# Patient Record
Sex: Female | Born: 1940 | Race: White | Hispanic: No | Smoking: Never smoker
Health system: Southern US, Community
[De-identification: ages and names within clinical notes are randomized; demographics above are authoritative.]

## PROBLEM LIST (undated history)

## (undated) DIAGNOSIS — I8390 Asymptomatic varicose veins of unspecified lower extremity: Secondary | ICD-10-CM

## (undated) DIAGNOSIS — I10 Essential (primary) hypertension: Secondary | ICD-10-CM

## (undated) DIAGNOSIS — S82892A Other fracture of left lower leg, initial encounter for closed fracture: Secondary | ICD-10-CM

## (undated) DIAGNOSIS — M1990 Unspecified osteoarthritis, unspecified site: Secondary | ICD-10-CM

## (undated) DIAGNOSIS — R7301 Impaired fasting glucose: Secondary | ICD-10-CM

## (undated) DIAGNOSIS — G500 Trigeminal neuralgia: Secondary | ICD-10-CM

## (undated) DIAGNOSIS — J45909 Unspecified asthma, uncomplicated: Secondary | ICD-10-CM

## (undated) DIAGNOSIS — F329 Major depressive disorder, single episode, unspecified: Secondary | ICD-10-CM

## (undated) DIAGNOSIS — E785 Hyperlipidemia, unspecified: Secondary | ICD-10-CM

## (undated) DIAGNOSIS — I8000 Phlebitis and thrombophlebitis of superficial vessels of unspecified lower extremity: Secondary | ICD-10-CM

## (undated) DIAGNOSIS — F32A Depression, unspecified: Secondary | ICD-10-CM

## (undated) DIAGNOSIS — S82891A Other fracture of right lower leg, initial encounter for closed fracture: Secondary | ICD-10-CM

## (undated) DIAGNOSIS — L439 Lichen planus, unspecified: Secondary | ICD-10-CM

## (undated) HISTORY — PX: COLONOSCOPY: SHX174

## (undated) HISTORY — DX: Phlebitis and thrombophlebitis of superficial vessels of unspecified lower extremity: I80.00

## (undated) HISTORY — DX: Impaired fasting glucose: R73.01

## (undated) HISTORY — DX: Depression, unspecified: F32.A

## (undated) HISTORY — PX: ABDOMINAL HYSTERECTOMY: SHX81

## (undated) HISTORY — DX: Other fracture of right lower leg, initial encounter for closed fracture: S82.891A

## (undated) HISTORY — DX: Unspecified asthma, uncomplicated: J45.909

## (undated) HISTORY — DX: Trigeminal neuralgia: G50.0

## (undated) HISTORY — DX: Asymptomatic varicose veins of unspecified lower extremity: I83.90

## (undated) HISTORY — DX: Major depressive disorder, single episode, unspecified: F32.9

## (undated) HISTORY — DX: Hyperlipidemia, unspecified: E78.5

## (undated) HISTORY — DX: Unspecified osteoarthritis, unspecified site: M19.90

## (undated) HISTORY — DX: Essential (primary) hypertension: I10

## (undated) HISTORY — DX: Lichen planus, unspecified: L43.9

## (undated) HISTORY — DX: Other fracture of left lower leg, initial encounter for closed fracture: S82.892A

---

## 1999-05-12 ENCOUNTER — Encounter: Payer: Self-pay | Admitting: Family Medicine

## 1999-05-12 ENCOUNTER — Encounter: Admission: RE | Admit: 1999-05-12 | Discharge: 1999-05-12 | Payer: Self-pay | Admitting: Family Medicine

## 1999-07-27 ENCOUNTER — Other Ambulatory Visit: Admission: RE | Admit: 1999-07-27 | Discharge: 1999-07-27 | Payer: Self-pay | Admitting: Family Medicine

## 2000-07-26 ENCOUNTER — Encounter: Payer: Self-pay | Admitting: Family Medicine

## 2000-07-26 ENCOUNTER — Encounter: Admission: RE | Admit: 2000-07-26 | Discharge: 2000-07-26 | Payer: Self-pay | Admitting: Family Medicine

## 2000-11-02 ENCOUNTER — Other Ambulatory Visit: Admission: RE | Admit: 2000-11-02 | Discharge: 2000-11-02 | Payer: Self-pay | Admitting: Family Medicine

## 2001-10-12 ENCOUNTER — Encounter: Admission: RE | Admit: 2001-10-12 | Discharge: 2001-10-12 | Payer: Self-pay | Admitting: Family Medicine

## 2001-10-12 ENCOUNTER — Encounter: Payer: Self-pay | Admitting: Family Medicine

## 2001-10-25 ENCOUNTER — Other Ambulatory Visit: Admission: RE | Admit: 2001-10-25 | Discharge: 2001-10-25 | Payer: Self-pay | Admitting: Family Medicine

## 2002-12-06 ENCOUNTER — Encounter: Payer: Self-pay | Admitting: Family Medicine

## 2002-12-06 ENCOUNTER — Encounter: Admission: RE | Admit: 2002-12-06 | Discharge: 2002-12-06 | Payer: Self-pay | Admitting: Family Medicine

## 2003-07-06 DIAGNOSIS — S82892A Other fracture of left lower leg, initial encounter for closed fracture: Secondary | ICD-10-CM

## 2003-07-06 DIAGNOSIS — S82891A Other fracture of right lower leg, initial encounter for closed fracture: Secondary | ICD-10-CM

## 2003-07-06 HISTORY — DX: Other fracture of right lower leg, initial encounter for closed fracture: S82.891A

## 2003-07-06 HISTORY — DX: Other fracture of right lower leg, initial encounter for closed fracture: S82.892A

## 2004-06-16 ENCOUNTER — Encounter: Admission: RE | Admit: 2004-06-16 | Discharge: 2004-06-16 | Payer: Self-pay | Admitting: Family Medicine

## 2005-09-28 ENCOUNTER — Encounter: Admission: RE | Admit: 2005-09-28 | Discharge: 2005-09-28 | Payer: Self-pay | Admitting: Family Medicine

## 2007-03-21 ENCOUNTER — Encounter: Admission: RE | Admit: 2007-03-21 | Discharge: 2007-03-21 | Payer: Self-pay | Admitting: Family Medicine

## 2007-05-29 ENCOUNTER — Ambulatory Visit (HOSPITAL_COMMUNITY): Admission: RE | Admit: 2007-05-29 | Discharge: 2007-05-29 | Payer: Self-pay | Admitting: Internal Medicine

## 2007-06-20 ENCOUNTER — Encounter: Payer: Self-pay | Admitting: Pulmonary Disease

## 2007-07-03 ENCOUNTER — Ambulatory Visit: Payer: Self-pay | Admitting: Pulmonary Disease

## 2007-07-03 DIAGNOSIS — J479 Bronchiectasis, uncomplicated: Secondary | ICD-10-CM

## 2007-07-03 DIAGNOSIS — R930 Abnormal findings on diagnostic imaging of skull and head, not elsewhere classified: Secondary | ICD-10-CM

## 2007-07-23 DIAGNOSIS — K219 Gastro-esophageal reflux disease without esophagitis: Secondary | ICD-10-CM | POA: Insufficient documentation

## 2007-07-23 DIAGNOSIS — J309 Allergic rhinitis, unspecified: Secondary | ICD-10-CM | POA: Insufficient documentation

## 2007-11-01 ENCOUNTER — Telehealth (INDEPENDENT_AMBULATORY_CARE_PROVIDER_SITE_OTHER): Payer: Self-pay | Admitting: *Deleted

## 2008-03-21 ENCOUNTER — Encounter: Admission: RE | Admit: 2008-03-21 | Discharge: 2008-03-21 | Payer: Self-pay | Admitting: Family Medicine

## 2009-09-09 ENCOUNTER — Encounter: Admission: RE | Admit: 2009-09-09 | Discharge: 2009-09-09 | Payer: Self-pay | Admitting: Internal Medicine

## 2011-05-06 ENCOUNTER — Other Ambulatory Visit: Payer: Self-pay | Admitting: Internal Medicine

## 2011-05-06 DIAGNOSIS — Z1231 Encounter for screening mammogram for malignant neoplasm of breast: Secondary | ICD-10-CM

## 2011-06-02 ENCOUNTER — Ambulatory Visit
Admission: RE | Admit: 2011-06-02 | Discharge: 2011-06-02 | Disposition: A | Discharge status: Home / Self Care | Payer: BC Managed Care – PPO | Source: Ambulatory Visit | Attending: Internal Medicine | Admitting: Internal Medicine

## 2011-06-02 DIAGNOSIS — Z1231 Encounter for screening mammogram for malignant neoplasm of breast: Secondary | ICD-10-CM

## 2012-05-24 ENCOUNTER — Other Ambulatory Visit: Payer: Self-pay | Admitting: Internal Medicine

## 2012-05-24 DIAGNOSIS — Z1231 Encounter for screening mammogram for malignant neoplasm of breast: Secondary | ICD-10-CM

## 2012-07-03 ENCOUNTER — Ambulatory Visit
Admission: RE | Admit: 2012-07-03 | Discharge: 2012-07-03 | Disposition: A | Discharge status: Home / Self Care | Payer: Medicare Other | Source: Ambulatory Visit | Attending: Internal Medicine | Admitting: Internal Medicine

## 2012-07-03 DIAGNOSIS — Z1231 Encounter for screening mammogram for malignant neoplasm of breast: Secondary | ICD-10-CM

## 2014-04-02 ENCOUNTER — Other Ambulatory Visit: Payer: Self-pay

## 2014-04-02 DIAGNOSIS — Z1231 Encounter for screening mammogram for malignant neoplasm of breast: Secondary | ICD-10-CM

## 2014-04-23 ENCOUNTER — Ambulatory Visit
Admission: RE | Admit: 2014-04-23 | Discharge: 2014-04-23 | Disposition: A | Discharge status: Home / Self Care | Payer: Medicare PPO | Source: Ambulatory Visit

## 2014-04-23 DIAGNOSIS — Z1231 Encounter for screening mammogram for malignant neoplasm of breast: Secondary | ICD-10-CM

## 2015-01-22 DIAGNOSIS — H2513 Age-related nuclear cataract, bilateral: Secondary | ICD-10-CM | POA: Diagnosis not present

## 2015-02-27 DIAGNOSIS — Z6828 Body mass index (BMI) 28.0-28.9, adult: Secondary | ICD-10-CM | POA: Diagnosis not present

## 2015-02-27 DIAGNOSIS — I10 Essential (primary) hypertension: Secondary | ICD-10-CM | POA: Diagnosis not present

## 2015-02-27 DIAGNOSIS — Z7901 Long term (current) use of anticoagulants: Secondary | ICD-10-CM | POA: Diagnosis not present

## 2015-02-27 DIAGNOSIS — F329 Major depressive disorder, single episode, unspecified: Secondary | ICD-10-CM | POA: Diagnosis not present

## 2015-02-27 DIAGNOSIS — K068 Other specified disorders of gingiva and edentulous alveolar ridge: Secondary | ICD-10-CM | POA: Diagnosis not present

## 2015-02-27 DIAGNOSIS — E785 Hyperlipidemia, unspecified: Secondary | ICD-10-CM | POA: Diagnosis not present

## 2015-02-27 DIAGNOSIS — M7061 Trochanteric bursitis, right hip: Secondary | ICD-10-CM | POA: Diagnosis not present

## 2015-02-27 DIAGNOSIS — L439 Lichen planus, unspecified: Secondary | ICD-10-CM | POA: Diagnosis not present

## 2015-04-15 DIAGNOSIS — E785 Hyperlipidemia, unspecified: Secondary | ICD-10-CM | POA: Diagnosis not present

## 2015-04-15 DIAGNOSIS — Z0000 Encounter for general adult medical examination without abnormal findings: Secondary | ICD-10-CM | POA: Diagnosis not present

## 2015-04-15 DIAGNOSIS — I10 Essential (primary) hypertension: Secondary | ICD-10-CM | POA: Diagnosis not present

## 2015-04-22 DIAGNOSIS — E785 Hyperlipidemia, unspecified: Secondary | ICD-10-CM | POA: Diagnosis not present

## 2015-04-22 DIAGNOSIS — J45909 Unspecified asthma, uncomplicated: Secondary | ICD-10-CM | POA: Diagnosis not present

## 2015-04-22 DIAGNOSIS — M5431 Sciatica, right side: Secondary | ICD-10-CM | POA: Diagnosis not present

## 2015-04-22 DIAGNOSIS — I10 Essential (primary) hypertension: Secondary | ICD-10-CM | POA: Diagnosis not present

## 2015-04-22 DIAGNOSIS — Z0000 Encounter for general adult medical examination without abnormal findings: Secondary | ICD-10-CM | POA: Diagnosis not present

## 2015-04-22 DIAGNOSIS — J42 Unspecified chronic bronchitis: Secondary | ICD-10-CM | POA: Diagnosis not present

## 2015-04-22 DIAGNOSIS — L439 Lichen planus, unspecified: Secondary | ICD-10-CM | POA: Diagnosis not present

## 2015-04-22 DIAGNOSIS — F325 Major depressive disorder, single episode, in full remission: Secondary | ICD-10-CM | POA: Diagnosis not present

## 2015-04-22 DIAGNOSIS — J309 Allergic rhinitis, unspecified: Secondary | ICD-10-CM | POA: Diagnosis not present

## 2015-04-25 DIAGNOSIS — Z1212 Encounter for screening for malignant neoplasm of rectum: Secondary | ICD-10-CM | POA: Diagnosis not present

## 2015-05-05 DIAGNOSIS — I809 Phlebitis and thrombophlebitis of unspecified site: Secondary | ICD-10-CM | POA: Diagnosis not present

## 2015-05-05 DIAGNOSIS — R600 Localized edema: Secondary | ICD-10-CM | POA: Diagnosis not present

## 2015-05-05 DIAGNOSIS — I10 Essential (primary) hypertension: Secondary | ICD-10-CM | POA: Diagnosis not present

## 2015-05-05 DIAGNOSIS — I8390 Asymptomatic varicose veins of unspecified lower extremity: Secondary | ICD-10-CM | POA: Diagnosis not present

## 2015-05-05 DIAGNOSIS — Z6827 Body mass index (BMI) 27.0-27.9, adult: Secondary | ICD-10-CM | POA: Diagnosis not present

## 2015-05-09 ENCOUNTER — Encounter (HOSPITAL_COMMUNITY): Payer: Medicare PPO

## 2015-05-12 ENCOUNTER — Ambulatory Visit (HOSPITAL_COMMUNITY)
Admission: RE | Admit: 2015-05-12 | Discharge: 2015-05-12 | Disposition: A | Discharge status: Home / Self Care | Payer: Medicare PPO | Source: Ambulatory Visit | Attending: Surgery | Admitting: Surgery

## 2015-05-12 ENCOUNTER — Other Ambulatory Visit (HOSPITAL_COMMUNITY): Payer: Self-pay | Admitting: Internal Medicine

## 2015-05-12 DIAGNOSIS — I809 Phlebitis and thrombophlebitis of unspecified site: Secondary | ICD-10-CM | POA: Insufficient documentation

## 2015-05-12 DIAGNOSIS — I8390 Asymptomatic varicose veins of unspecified lower extremity: Secondary | ICD-10-CM | POA: Diagnosis not present

## 2015-05-16 ENCOUNTER — Encounter: Payer: Self-pay | Admitting: Vascular Surgery

## 2015-05-16 ENCOUNTER — Other Ambulatory Visit: Payer: Self-pay | Admitting: *Deleted

## 2015-05-16 DIAGNOSIS — I83893 Varicose veins of bilateral lower extremities with other complications: Secondary | ICD-10-CM

## 2015-06-25 ENCOUNTER — Encounter: Payer: Self-pay | Admitting: Vascular Surgery

## 2015-07-01 ENCOUNTER — Encounter: Payer: Medicare PPO | Admitting: Vascular Surgery

## 2015-07-11 ENCOUNTER — Encounter: Payer: Self-pay | Admitting: Vascular Surgery

## 2015-07-16 ENCOUNTER — Encounter: Payer: Self-pay | Admitting: Vascular Surgery

## 2015-07-16 ENCOUNTER — Ambulatory Visit (INDEPENDENT_AMBULATORY_CARE_PROVIDER_SITE_OTHER): Payer: Medicare Other | Admitting: Vascular Surgery

## 2015-07-16 VITALS — BP 128/79 | HR 83 | Temp 97.4°F | Resp 16 | Ht 66.5 in | Wt 166.0 lb

## 2015-07-16 DIAGNOSIS — I83813 Varicose veins of bilateral lower extremities with pain: Secondary | ICD-10-CM | POA: Diagnosis not present

## 2015-07-16 NOTE — Progress Notes (Signed)
[facility] [place] HISTORY AND PHYSICAL   History of Present Illness:  Patient is a 75 y.o. female who presents for evaluation of symptomatic varicose veins. The patient recently had an episode of thrombophlebitis in her right leg about 3 months ago. She was treated with Keflex and anti-inflammatories. This has improved somewhat but she can still feel "hard spots in her vein"; she denies any prior episodes of thrombophlebitis in her right leg. She has had several bleeding episodes from veins around her ankle in the left leg in the past.  She denies prior history of DVT. She was given a prescription for 20-30 mm knee-high compression stockings by Dr. Sherryll Burger. However she admits to being not very compliant with these due to pain over the area of her thrombophlebitis. She denies symptoms of claudication. She does have a family history of varicose veins in her sisters and her mother. She denies previous operations on her legs. She currently takes aspirin 81 mg daily. She denies prior history of stroke or cardial infarction..  Other medical problems include hyperlipidemia, asthma, elevated serum glucose all of which have been stable.  Past Medical History  Diagnosis Date  . Varicose veins   . Thrombophlebitis leg superficial     right leg  . Hyperlipidemia   . Asthma   . Depression   . Hypertension   . IFG (impaired fasting glucose)   . OA (osteoarthritis)     hands  . Lichen planus   . Bilateral ankle fractures 2005    Past Surgical History  Procedure Laterality Date  . Abdominal hysterectomy      partial    Social History Social History  Substance Use Topics  . Smoking status: Never Smoker   . Smokeless tobacco: Never Used  . Alcohol Use: No    Family History Family History  Problem Relation Age of Onset  . Stroke Mother   . Cancer Father     bladder    Allergies  No Known Allergies   Current Outpatient Prescriptions  Medication Sig Dispense Refill   . aspirin 81 MG tablet Take 81 mg by mouth every other day.    . budesonide-formoterol (SYMBICORT) 160-4.5 MCG/ACT inhaler Inhale 2 puffs into the lungs 2 (two) times daily.    Marland Kitchen ezetimibe (ZETIA) 10 MG tablet Take 10 mg by mouth daily.    Marland Kitchen losartan (COZAAR) 50 MG tablet Take 50 mg by mouth 2 (two) times daily.    Marland Kitchen venlafaxine XR (EFFEXOR-XR) 75 MG 24 hr capsule Take 75 mg by mouth 2 (two) times daily.    . cephALEXin (KEFLEX) 500 MG capsule Take 500 mg by mouth 3 (three) times daily. Reported on 07/16/2015    . Probiotic Product (PROBIOTIC DAILY PO) Take by mouth daily. Reported on 07/16/2015     No current facility-administered medications for this visit.    ROS:   General:  No weight loss, Fever, chills  HEENT: No recent headaches, no nasal bleeding, no visual changes, no sore throat  Neurologic: No dizziness, blackouts, seizures. No recent symptoms of stroke or mini- stroke. No recent episodes of slurred speech, or temporary blindness.  Cardiac: No recent episodes of chest pain/pressure, no shortness of breath at rest.  No shortness of breath with exertion.  Denies history of atrial fibrillation or irregular heartbeat  Vascular: No history of rest pain in feet.  No history of claudication.  No history of non-healing ulcer, No history of DVT   Pulmonary: No home  oxygen, no productive cough, no hemoptysis,  No asthma or wheezing  Musculoskeletal:  [ ]  Arthritis, [ ]  Low back pain,  [ ]  Joint pain  Hematologic:No history of hypercoagulable state.  No history of easy bleeding.  No history of anemia  Gastrointestinal: No hematochezia or melena,  No gastroesophageal reflux, no trouble swallowing  Urinary: [ ]  chronic Kidney disease, [ ]  on HD - [ ]  MWF or [ ]  TTHS, [ ]  Burning with urination, [ ]  Frequent urination, [ ]  Difficulty urinating;   Skin: No rashes  Psychological: No history of anxiety,  + history of depression   Physical Examination  Filed Vitals:   07/16/15 1111   BP: 128/79  Pulse: 83  Temp: 97.4 F (36.3 C)  Resp: 16  Height: 5' 6.5" (1.689 m)  Weight: 166 lb (75.297 kg)  SpO2: 96%    Body mass index is 26.39 kg/(m^2).  General:  Alert and oriented, no acute distress HEENT: Normal Neck: No bruit or JVD Pulmonary: Clear to auscultation bilaterally Cardiac: Regular Rate and Rhythm without murmur Abdomen: Soft, non-tender, non-distended, no mass Skin: No rash, diffuse reticular type varicosities scattered primarily circumferentially around the ankles but also diffusely throughout both legs, multiple varicosities along the course of the greater saphenous vein bilaterally ranging from 6-10 mm in diameter. 2 areas in the left malleolar area which the patient described as previous areas of bleeding from varicosities. Extremity Pulses:  2+ radial, brachial, femoral, absent left pedal pulses, 2+ right posterior tibial pulse, absent right dorsalis pedis pulse.  Musculoskeletal: No deformity or edema  Neurologic: Upper and lower extremity motor 5/5 and symmetric  DATA:  Venous duplex ultrasound dated 05/12/2015 was reviewed. This showed evidence of superficial thrombophlebitis in the right greater saphenous vein was diffuse reflux throughout the right greater saphenous vein. There was also deep vein reflux in the right leg.   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 Recent symptoms of thrombophlebitis improved but not completely resolved. Patient with prior bleeding episodes and evidence clinically of superficial reflux in the left leg.   PLAN:  Patient will most likely benefit from laser ablation of bilateral greater saphenous veins. She was given a prescription today for thigh-high 20-30 mm compression stockings for both legs. She will follow-up in 3 months to consider laser ablation. When she returns in 3 months we will also obtain a duplex ultrasound of her left lower extremity which I suspect will also show similar  findings to the right.  Fabienne Brunsharles Fields, MD [facility] [place] Office: [phone] Pager: [phone]

## 2015-10-13 ENCOUNTER — Encounter: Payer: Self-pay | Admitting: Vascular Surgery

## 2015-10-14 ENCOUNTER — Encounter: Payer: Self-pay | Admitting: Vascular Surgery

## 2015-10-14 ENCOUNTER — Other Ambulatory Visit: Payer: Self-pay | Admitting: *Deleted

## 2015-10-14 ENCOUNTER — Ambulatory Visit (INDEPENDENT_AMBULATORY_CARE_PROVIDER_SITE_OTHER): Payer: Medicare Other | Admitting: Vascular Surgery

## 2015-10-14 ENCOUNTER — Ambulatory Visit (HOSPITAL_COMMUNITY)
Admission: RE | Admit: 2015-10-14 | Discharge: 2015-10-14 | Disposition: A | Discharge status: Home / Self Care | Payer: Medicare Other | Source: Ambulatory Visit | Attending: Vascular Surgery | Admitting: Vascular Surgery

## 2015-10-14 VITALS — BP 120/75 | HR 86 | Temp 98.0°F | Resp 16 | Ht 66.5 in | Wt 165.0 lb

## 2015-10-14 DIAGNOSIS — I83893 Varicose veins of bilateral lower extremities with other complications: Secondary | ICD-10-CM

## 2015-10-14 DIAGNOSIS — E785 Hyperlipidemia, unspecified: Secondary | ICD-10-CM | POA: Insufficient documentation

## 2015-10-14 DIAGNOSIS — I10 Essential (primary) hypertension: Secondary | ICD-10-CM | POA: Insufficient documentation

## 2015-10-14 DIAGNOSIS — R609 Edema, unspecified: Secondary | ICD-10-CM | POA: Diagnosis present

## 2015-10-14 NOTE — Progress Notes (Signed)
Subjective:     Patient ID: Haley Fuller, female   DOB: [birth year], 75 y.o.   MRN: [id number]  HPI  This 75 year old female returns for continued follow-up regarding her bilateral painful varicosities and history of bleeding from a varix in the left ankle on 2 occasions and thrombophlebitis in the right distal thigh on 1 occasion. Patient is tried long-leg elastic compression stockings with no improvement in her symptoms which include aching and throbbing as well as swelling in both ankles and feet as the day progresses. She is tried elevation and ibuprofen as well with no improvement. This is affecting her daily living.  Past Medical History  Diagnosis Date  . Varicose veins   . Thrombophlebitis leg superficial     right leg  . Hyperlipidemia   . Asthma   . Depression   . Hypertension   . IFG (impaired fasting glucose)   . OA (osteoarthritis)     hands  . Lichen planus   . Bilateral ankle fractures 2005    Social History  Substance Use Topics  . Smoking status: Never Smoker   . Smokeless tobacco: Never Used  . Alcohol Use: No    Family History  Problem Relation Age of Onset  . Stroke Mother   . Cancer Father     bladder    No Known Allergies   Current outpatient prescriptions:  .  aspirin 81 MG tablet, Take 81 mg by mouth every other day., Disp: , Rfl:  .  budesonide-formoterol (SYMBICORT) 160-4.5 MCG/ACT inhaler, Inhale 2 puffs into the lungs 2 (two) times daily., Disp: , Rfl:  .  cephALEXin (KEFLEX) 500 MG capsule, Take 500 mg by mouth 3 (three) times daily. Reported on 07/16/2015, Disp: , Rfl:  .  ezetimibe (ZETIA) 10 MG tablet, Take 10 mg by mouth daily., Disp: , Rfl:  .  losartan (COZAAR) 50 MG tablet, Take 50 mg by mouth 2 (two) times daily., Disp: , Rfl:  .  Probiotic Product (PROBIOTIC DAILY PO), Take by mouth daily. Reported on 07/16/2015, Disp: , Rfl:  .  venlafaxine XR (EFFEXOR-XR) 75 MG 24 hr capsule, Take 75 mg by mouth 2 (two) times daily., Disp: , Rfl:    Filed Vitals:   10/14/15 1108  BP: 120/75  Pulse: 86  Temp: 98 F (36.7 C)  Resp: 16  Height: 5' 6.5" (1.689 m)  Weight: 165 lb (74.844 kg)  SpO2: 99%    Body mass index is 26.24 kg/(m^2).          Review of Systems Denies chest pain, dyspnea on exertion, PND, orthopnea, hemoptysis, claudication     Objective:   Physical Exam BP 120/75 mmHg  Pulse 86  Temp(Src) 98 F (36.7 C)  Resp 16  Ht 5' 6.5" (1.689 m)  Wt 165 lb (74.844 kg)  BMI 26.24 kg/m2  SpO2 99%    Gen.-alert and oriented x3 in no apparent distress HEENT normal for age Lungs no rhonchi or wheezing Cardiovascular regular rhythm no murmurs carotid pulses 3+ palpable no bruits audible Abdomen soft nontender no palpable masses Musculoskeletal free of  major deformities Skin clear -no rashes Neurologic normal Lower extremities 3+ femoral and dorsalis pedis pulses palpable bilaterally with 1+ edema bilaterally  both legs with extensive large bulging varicosities over great saphenous system beginning in the medial thigh extending into the medial calf down to the medial malleolar areas bilaterally. Extensive network of reticular veins in both ankles lower third with area of previous bleeding anterior to  medial malleolus on the left. 3+ dorsalis pedis pulse palpable bilaterally.    patient has documented gross reflux bilateral great saphenous veins which are large caliber veins feeding these large bulbous painful varicosities with no DVT       Assessment:      painful varicosities causing swelling and bleeding on the left ankle on 1 occasion and thrombophlebitis in the right thigh on 1 occasion. Symptoms are affecting patient's daily living and not improved by conservative measures including long-leg elastic compression stockings, elevation, and ibuprofen.     Plan:      patient needs #1 laser ablation right great saphenous vein followed by #2 laser ablation left great saphenous vein. She should then  return in 3 months to be evaluated for stab phlebectomy of these large bulbous painful varicosities and sclerotherapy of the previous bleeding site in the left ankle Exline we will proceed with precertification to perform this in the near future and hopefully relieve her symptoms prevent further episodes of thrombophlebitis or bleeding

## 2015-11-07 ENCOUNTER — Encounter: Payer: Self-pay | Admitting: Vascular Surgery

## 2015-11-17 ENCOUNTER — Encounter: Payer: Self-pay | Admitting: Vascular Surgery

## 2015-11-17 ENCOUNTER — Ambulatory Visit (INDEPENDENT_AMBULATORY_CARE_PROVIDER_SITE_OTHER): Payer: Medicare Other | Admitting: Vascular Surgery

## 2015-11-17 VITALS — BP 134/71 | HR 80 | Temp 97.8°F | Resp 16 | Ht 66.5 in | Wt 165.0 lb

## 2015-11-17 DIAGNOSIS — I83891 Varicose veins of right lower extremities with other complications: Secondary | ICD-10-CM | POA: Diagnosis not present

## 2015-11-17 NOTE — Progress Notes (Signed)
Laser Ablation Procedure    Date: 11/17/2015   Haley Fuller DOB:05-07-1941  Consent signed: Yes    Surgeon:  Dr. Quita SkyeJames D. Hart RochesterLawson  Procedure: Laser Ablation: right Greater Saphenous Vein  BP 134/71 mmHg  Pulse 80  Temp(Src) 97.8 F (36.6 C)  Resp 16  Ht 5' 6.5" (1.689 m)  Wt 165 lb (74.844 kg)  BMI 26.24 kg/m2  SpO2 100%  Tumescent Anesthesia: 400 cc 0.9% NaCl with 50 cc Lidocaine HCL with 1% Epi and 15 cc 8.4% NaHCO3  Local Anesthesia: 4 cc Lidocaine HCL and NaHCO3 (ratio 2:1)  Pulsed Mode: 15 watts, 500ms delay, 1.0 duration  Total Energy:1497              Total Pulses:  101              Total Time: 1:40    Patient tolerated procedure well  Notes:   Description of Procedure:  After marking the course of the secondary varicosities, the patient was placed on the operating table in the supine position, and the right leg was prepped and draped in sterile fashion.   Local anesthetic was administered and under ultrasound guidance the saphenous vein was accessed with a micro needle and guide wire; then the mirco puncture sheath was placed.  A guide wire was inserted saphenofemoral junction , followed by a 5 french sheath.  The position of the sheath and then the laser fiber below the junction was confirmed using the ultrasound.  Tumescent anesthesia was administered along the course of the saphenous vein using ultrasound guidance. The patient was placed in Trendelenburg position and protective laser glasses were placed on patient and staff, and the laser was fired at 15 watts continuous mode advancing 1-242mm/second for a total of 1497 joules.      Steri strips were applied to the stab wounds and ABD pads and thigh high compression stockings were applied.  Ace wrap bandages were applied over the phlebectomy sites and at the top of the saphenofemoral junction. Blood loss was less than 15 cc.  The patient ambulated out of the operating room having tolerated the procedure well.

## 2015-11-17 NOTE — Progress Notes (Signed)
Subjective:     Patient ID: Haley AzureLinda T Fuller, female   DOB: 1940-12-29, 75 y.o.   MRN: [id number]  HPI this 75 year old female had laser ablation of the right great saphenous vein performed under local tumescent anesthesia for gross reflux and painful varicosities. She tolerated the procedure well. Review of Systems     Objective:   Physical Exam BP 134/71 mmHg  Pulse 80  Temp(Src) 97.8 F (36.6 C)  Resp 16  Ht 5' 6.5" (1.689 m)  Wt 165 lb (74.844 kg)  BMI 26.24 kg/m2  SpO2 100%       Assessment:     Well-tolerated laser ablation right great saphenous vein performed under local tumescent anesthesia from distal thigh to near the saphenofemoral junction    Plan:     Return in 1 week for venous duplex exam to confirm closure right great saphenous vein Patient will then have similar procedure and contralateral left leg the following week

## 2015-11-18 ENCOUNTER — Telehealth: Payer: Self-pay | Admitting: *Deleted

## 2015-11-18 ENCOUNTER — Encounter: Payer: Self-pay | Admitting: Vascular Surgery

## 2015-11-18 NOTE — Telephone Encounter (Signed)
Left message asking pt to call if she has any questions or concerns.

## 2015-11-21 ENCOUNTER — Encounter (HOSPITAL_COMMUNITY): Payer: Medicare Other

## 2015-11-24 ENCOUNTER — Ambulatory Visit: Payer: Medicare Other | Admitting: Vascular Surgery

## 2015-11-24 ENCOUNTER — Ambulatory Visit (INDEPENDENT_AMBULATORY_CARE_PROVIDER_SITE_OTHER): Payer: Medicare Other | Admitting: Vascular Surgery

## 2015-11-24 ENCOUNTER — Ambulatory Visit (HOSPITAL_COMMUNITY)
Admission: RE | Admit: 2015-11-24 | Discharge: 2015-11-24 | Disposition: A | Discharge status: Home / Self Care | Payer: Medicare Other | Source: Ambulatory Visit | Attending: Vascular Surgery | Admitting: Vascular Surgery

## 2015-11-24 ENCOUNTER — Encounter: Payer: Self-pay | Admitting: Vascular Surgery

## 2015-11-24 VITALS — BP 132/78 | HR 75 | Temp 98.6°F | Resp 16 | Ht 66.5 in | Wt 165.0 lb

## 2015-11-24 DIAGNOSIS — I83891 Varicose veins of right lower extremities with other complications: Secondary | ICD-10-CM

## 2015-11-24 DIAGNOSIS — I83893 Varicose veins of bilateral lower extremities with other complications: Secondary | ICD-10-CM | POA: Diagnosis not present

## 2015-11-24 DIAGNOSIS — Z9889 Other specified postprocedural states: Secondary | ICD-10-CM | POA: Diagnosis not present

## 2015-11-24 NOTE — Progress Notes (Signed)
Subjective:     Patient ID: Haley AzureLinda T Monterrosa, female   DOB: [birth year], 75 y.o.   MRN: [id number]  HPI this patient returns 1 week post-laser ablation right great saphenous veinfor  painful varicosities.She states that she has had some mild-to-moderate discomfort in the medial thigh as one would expect. She has worn her elastic compression stocking and taken ibuprofen as instructed. She has not noticed any distal edema. She states that she has been ambulating long distances. Past Medical History  Diagnosis Date  . Varicose veins   . Thrombophlebitis leg superficial     right leg  . Hyperlipidemia   . Asthma   . Depression   . Hypertension   . IFG (impaired fasting glucose)   . OA (osteoarthritis)     hands  . Lichen planus   . Bilateral ankle fractures 2005    Social History  Substance Use Topics  . Smoking status: Never Smoker   . Smokeless tobacco: Never Used  . Alcohol Use: No    Family History  Problem Relation Age of Onset  . Stroke Mother   . Cancer Father     bladder    No Known Allergies   Current outpatient prescriptions:  .  aspirin 81 MG tablet, Take 81 mg by mouth every other day., Disp: , Rfl:  .  budesonide-formoterol (SYMBICORT) 160-4.5 MCG/ACT inhaler, Inhale 2 puffs into the lungs 2 (two) times daily., Disp: , Rfl:  .  ezetimibe (ZETIA) 10 MG tablet, Take 10 mg by mouth daily., Disp: , Rfl:  .  losartan (COZAAR) 50 MG tablet, Take 50 mg by mouth 2 (two) times daily., Disp: , Rfl:  .  venlafaxine XR (EFFEXOR-XR) 75 MG 24 hr capsule, Take 75 mg by mouth 2 (two) times daily., Disp: , Rfl:  .  cephALEXin (KEFLEX) 500 MG capsule, Take 500 mg by mouth 3 (three) times daily. Reported on 11/24/2015, Disp: , Rfl:  .  Probiotic Product (PROBIOTIC DAILY PO), Take by mouth daily. Reported on 11/24/2015, Disp: , Rfl:   Filed Vitals:   11/24/15 1523  BP: 132/78  Pulse: 75  Temp: 98.6 F (37 C)  Resp: 16  Height: 5' 6.5" (1.689 m)  Weight: 165 lb (74.844 kg)  SpO2:  96%    Body mass index is 26.24 kg/(m^2).           Review of Systems denies chest pain, dyspnea on exertion, PND, orthopnea, hemoptysis, claudication.     Objective:   Physical Exam BP 132/78 mmHg  Pulse 75  Temp(Src) 98.6 F (37 C)  Resp 16  Ht 5' 6.5" (1.689 m)  Wt 165 lb (74.844 kg)  BMI 26.24 kg/m2  SpO2 96%  Gen. well-developed well-nourished female no apparent distress alert and oriented 3 Lungs no rhonchi or wheezing Cardiovascular regular rhythm no murmurs Right leg with mild discomfort to palpation over great saphenous vein. Moderate ecchymosis from groin to distal thigh. Great great saphenous vein is palpable. No distal edema noted. 3+ dorsalis pedis pulse palpable.  Today I ordered a venous duplex exam of the right leg which I reviewed and interpreted. There is no DVT. There is total closure of the right great saphenous vein from the proximal calf to near the saphenofemoral junction     Assessment:     Successful laser ablation right great saphenous vein performed for painful varicosities with gross reflux in the saphenous system    Plan:     Return June 6 for similar procedure on  contralateral left leg

## 2015-12-02 ENCOUNTER — Encounter: Payer: Self-pay | Admitting: Vascular Surgery

## 2015-12-09 ENCOUNTER — Ambulatory Visit (INDEPENDENT_AMBULATORY_CARE_PROVIDER_SITE_OTHER): Payer: Medicare Other | Admitting: Vascular Surgery

## 2015-12-09 ENCOUNTER — Other Ambulatory Visit: Payer: Medicare Other | Admitting: Vascular Surgery

## 2015-12-09 DIAGNOSIS — I83892 Varicose veins of left lower extremities with other complications: Secondary | ICD-10-CM | POA: Insufficient documentation

## 2015-12-09 NOTE — Progress Notes (Signed)
Subjective:     Patient ID: Haley AzureLinda T Fuller, female   DOB: Mar 15, 1941, 75 y.o.   MRN: [id number]  HPI this 75 year old female had laser ablation of the left great saphenous vein from the distal thigh to near the saphenofemoral junction performed under local tumescent anesthesia. A total of 1302 J of energy was utilized. She tolerated the procedure well.   Review of Systems     Objective:   Physical Exam There were no vitals taken for this visit.       Assessment:     Well-tolerated laser ablation left great saphenous vein performed under local tumescent anesthesia    Plan:     Return in 1 week for venous duplex exam to confirm closure left great saphenous vein

## 2015-12-09 NOTE — Progress Notes (Signed)
Laser Ablation Procedure    Date: 12/09/2015   Haley AzureLinda T Fuller DOB:1941/03/01  Consent signed: Yes    Surgeon:  Dr. Quita SkyeJames D. Hart RochesterLawson  Procedure: Laser Ablation: left Greater Saphenous Vein  There were no vitals taken for this visit.  Tumescent Anesthesia: 300 cc 0.9% NaCl with 50 cc Lidocaine HCL with 1% Epi and 15 cc 8.4% NaHCO3  Local Anesthesia: 3 cc Lidocaine HCL and NaHCO3 (ratio 2:1)  Pulsed Mode: 15 watts, 500ms delay, 1.0 duration  Total Energy:  1302            Total Pulses: 87               Total Time: 1:27    Patient tolerated procedure well  Notes:   Description of Procedure:  After marking the course of the secondary varicosities, the patient was placed on the operating table in the supine position, and the left leg was prepped and draped in sterile fashion.   Local anesthetic was administered and under ultrasound guidance the saphenous vein was accessed with a micro needle and guide wire; then the mirco puncture sheath was placed.  A guide wire was inserted saphenofemoral junction , followed by a 5 french sheath.  The position of the sheath and then the laser fiber below the junction was confirmed using the ultrasound.  Tumescent anesthesia was administered along the course of the saphenous vein using ultrasound guidance. The patient was placed in Trendelenburg position and protective laser glasses were placed on patient and staff, and the laser was fired at 15 watts continuous mode advancing 1-352mm/second for a total of 1302 joules.    Steri strips were applied to the stab wounds and ABD pads and thigh high compression stockings were applied.  Ace wrap bandages were applied over the phlebectomy sites and at the top of the saphenofemoral junction. Blood loss was less than 15 cc.  The patient ambulated out of the operating room having tolerated the procedure well.

## 2015-12-10 ENCOUNTER — Encounter: Payer: Self-pay | Admitting: Vascular Surgery

## 2015-12-10 ENCOUNTER — Telehealth: Payer: Self-pay | Admitting: *Deleted

## 2015-12-10 NOTE — Telephone Encounter (Signed)
Pt called in to tell me she is doing well and following all the instructions. We will see her on Monday for her fu appts.

## 2015-12-11 ENCOUNTER — Encounter: Payer: Self-pay | Admitting: Vascular Surgery

## 2015-12-16 ENCOUNTER — Ambulatory Visit (INDEPENDENT_AMBULATORY_CARE_PROVIDER_SITE_OTHER): Payer: Medicare Other | Admitting: Vascular Surgery

## 2015-12-16 ENCOUNTER — Encounter: Payer: Self-pay | Admitting: Vascular Surgery

## 2015-12-16 ENCOUNTER — Ambulatory Visit (HOSPITAL_COMMUNITY)
Admission: RE | Admit: 2015-12-16 | Discharge: 2015-12-16 | Disposition: A | Discharge status: Home / Self Care | Payer: Medicare Other | Source: Ambulatory Visit | Attending: Vascular Surgery | Admitting: Vascular Surgery

## 2015-12-16 VITALS — BP 114/61 | HR 66 | Temp 98.0°F | Resp 16 | Ht 66.0 in | Wt 165.0 lb

## 2015-12-16 DIAGNOSIS — I83893 Varicose veins of bilateral lower extremities with other complications: Secondary | ICD-10-CM

## 2015-12-16 DIAGNOSIS — Z9889 Other specified postprocedural states: Secondary | ICD-10-CM | POA: Diagnosis present

## 2015-12-16 DIAGNOSIS — I868 Varicose veins of other specified sites: Secondary | ICD-10-CM

## 2015-12-16 DIAGNOSIS — E785 Hyperlipidemia, unspecified: Secondary | ICD-10-CM | POA: Insufficient documentation

## 2015-12-16 DIAGNOSIS — F329 Major depressive disorder, single episode, unspecified: Secondary | ICD-10-CM | POA: Insufficient documentation

## 2015-12-16 DIAGNOSIS — I10 Essential (primary) hypertension: Secondary | ICD-10-CM | POA: Insufficient documentation

## 2015-12-16 NOTE — Progress Notes (Signed)
Subjective:     Patient ID: Haley AzureLinda T Previti, female   DOB: 13-Feb-1941, 75 y.o.   MRN: [id number]  HPI this 75 year old female returns 1 week post-laser ablation left great saphenous vein performed for gross reflux with painful varicosities and previous bleeding episodes and left ankle reticular veins. She states that the edema in the left ankle has already improved and at the legs feel better. She's had mild to moderate discomfort in the thigh. She's had no recurrent bleeding at this point. She has been wearing her elastic compression stockings and taking ibuprofen as instructed.  Past Medical History  Diagnosis Date  . Varicose veins   . Thrombophlebitis leg superficial     right leg  . Hyperlipidemia   . Asthma   . Depression   . Hypertension   . IFG (impaired fasting glucose)   . OA (osteoarthritis)     hands  . Lichen planus   . Bilateral ankle fractures 2005    Social History  Substance Use Topics  . Smoking status: Never Smoker   . Smokeless tobacco: Never Used  . Alcohol Use: No    Family History  Problem Relation Age of Onset  . Stroke Mother   . Cancer Father     bladder    No Known Allergies   Current outpatient prescriptions:  .  aspirin 81 MG tablet, Take 81 mg by mouth every other day., Disp: , Rfl:  .  budesonide-formoterol (SYMBICORT) 160-4.5 MCG/ACT inhaler, Inhale 2 puffs into the lungs 2 (two) times daily., Disp: , Rfl:  .  cephALEXin (KEFLEX) 500 MG capsule, Take 500 mg by mouth 3 (three) times daily. Reported on 11/24/2015, Disp: , Rfl:  .  ezetimibe (ZETIA) 10 MG tablet, Take 10 mg by mouth daily., Disp: , Rfl:  .  losartan (COZAAR) 50 MG tablet, Take 50 mg by mouth 2 (two) times daily., Disp: , Rfl:  .  Probiotic Product (PROBIOTIC DAILY PO), Take by mouth daily. Reported on 11/24/2015, Disp: , Rfl:  .  venlafaxine XR (EFFEXOR-XR) 75 MG 24 hr capsule, Take 75 mg by mouth 2 (two) times daily., Disp: , Rfl:   Filed Vitals:   12/16/15 1542  BP: 114/61   Pulse: 66  Temp: 98 F (36.7 C)  Resp: 16  Height: 5\' 6"  (1.676 m)  Weight: 165 lb (74.844 kg)  SpO2: 100%    Body mass index is 26.64 kg/(m^2).           Review of Systems denies chest pain, dyspnea on exertion, PND, orthopnea, hemoptysis, claudication.     Objective:   Physical Exam BP 114/61 mmHg  Pulse 66  Temp(Src) 98 F (36.7 C)  Resp 16  Ht 5\' 6"  (1.676 m)  Wt 165 lb (74.844 kg)  BMI 26.64 kg/m2  SpO2 100%  Gen. well-developed well-nourished female no apparent distress alert and oriented 3 Lungs no rhonchi or wheezing Cardiovascular regular rhythm no murmurs Left leg with mild discomfort to palpation of great saphenous vein from distal thigh to near the saphenofemoral junction. A few thrombosed varicosities are noted in the distal thigh and proximal calf. Minimal distal edema. Extensive network of reticular veins in the ankle area were bleeding occurred previously.  Today I ordered a venous duplex exam the left leg which I reviewed and interpreted. There is no DVT. There is total closure of the left great saphenous vein from the distal thigh to near the saphenofemoral junction with a large thrombosed varicose vein arising from the  mid to distal thigh     Assessment:     Successful laser ablation bilateral great saphenous veins for gross reflux with painful varicosities and history of bleeding left ankle    Plan:     Return in 3 months to determine if stab phlebectomy of residual painful varicosities or sclerotherapy will be indicated

## 2016-03-18 ENCOUNTER — Encounter: Payer: Self-pay | Admitting: Vascular Surgery

## 2016-03-22 ENCOUNTER — Encounter: Payer: Self-pay | Admitting: Vascular Surgery

## 2016-03-22 ENCOUNTER — Ambulatory Visit (INDEPENDENT_AMBULATORY_CARE_PROVIDER_SITE_OTHER): Payer: Medicare Other | Admitting: Vascular Surgery

## 2016-03-22 VITALS — BP 130/77 | HR 77 | Temp 98.0°F | Resp 16 | Ht 66.5 in | Wt 162.0 lb

## 2016-03-22 DIAGNOSIS — I83893 Varicose veins of bilateral lower extremities with other complications: Secondary | ICD-10-CM | POA: Diagnosis not present

## 2016-03-22 NOTE — Progress Notes (Signed)
Subjective:     Patient ID: Haley AzureLinda T Mullenbach, female   DOB: October 03, 1940, 75 y.o.   MRN: [id number]  HPI this 75 year old female returns for continued follow-up regarding her bilateral great saphenous vein laser ablation procedure performed 3-4 months ago. 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 In general she states her legs feel much better with less edema. She has no history of DVT or thrombophlebitis. She has worn long leg elastic compression stockings.  Past Medical History:  Diagnosis Date  . Asthma   . Bilateral ankle fractures 2005  . Depression   . Hyperlipidemia   . Hypertension   . IFG (impaired fasting glucose)   . Lichen planus   . OA (osteoarthritis)    hands  . Thrombophlebitis leg superficial    right leg  . Varicose veins     Social History  Substance Use Topics  . Smoking status: Never Smoker  . Smokeless tobacco: Never Used  . Alcohol use No    Family History  Problem Relation Age of Onset  . Stroke Mother   . Cancer Father     bladder    No Known Allergies   Current Outpatient Prescriptions:  .  aspirin 81 MG tablet, Take 81 mg by mouth every other day., Disp: , Rfl:  .  budesonide-formoterol (SYMBICORT) 160-4.5 MCG/ACT inhaler, Inhale 2 puffs into the lungs 2 (two) times daily., Disp: , Rfl:  .  cephALEXin (KEFLEX) 500 MG capsule, Take 500 mg by mouth 3 (three) times daily. Reported on 11/24/2015, Disp: , Rfl:  .  ezetimibe (ZETIA) 10 MG tablet, Take 10 mg by mouth daily., Disp: , Rfl:  .  losartan (COZAAR) 50 MG tablet, Take 50 mg by mouth 2 (two) times daily., Disp: , Rfl:  .  Probiotic Product (PROBIOTIC DAILY PO), Take by mouth daily. Reported on 11/24/2015, Disp: , Rfl:  .  venlafaxine XR (EFFEXOR-XR) 75 MG 24 hr capsule, Take 75 mg by mouth 2 (two) times daily., Disp: , Rfl:   Vitals:   03/22/16 1055  BP: 130/77  Pulse: 77  Resp: 16  Temp: 98 F (36.7 C)  SpO2:  100%  Weight: 162 lb (73.5 kg)  Height: 5' 6.5" (1.689 m)    Body mass index is 25.76 kg/m.          Review of Systems Denies chest pain, dyspnea on exertion, PND, orthopnea, hemoptysis    Objective:   Physical Exam BP 130/77   Pulse 77   Temp 98 F (36.7 C)   Resp 16   Ht 5' 6.5" (1.689 m)   Wt 162 lb (73.5 kg)   SpO2 100%   BMI 25.76 kg/m   Gen. well-developed well-nourished female no apparent distress alert and oriented 3 Lungs no rhonchi or wheezing Bilateral lower extremities with bulging varicosities from the knee to the ankle within the pretibial and medial areas with no distal edema. 2+ dorsalis pedis pulse palpable. Extensive reticular veins bilaterally in the peri-malleolar areas.     Assessment:     Status post bilateral saphenous vein ablation procedures with residual painful varicosities bilaterally    Plan:     Patient needs #1 greater than 20 stab phlebectomy of painful varicosities right leg followed by #2 greater than 20 stab phlebectomy of painful varicosities left leg followed by #32 courses of sclerotherapy We'll proceed with precertification to perform this in the near future

## 2016-03-23 ENCOUNTER — Encounter: Payer: Self-pay | Admitting: Vascular Surgery

## 2016-03-23 ENCOUNTER — Ambulatory Visit: Payer: Medicare Other | Admitting: Vascular Surgery

## 2016-03-29 ENCOUNTER — Encounter: Payer: Self-pay | Admitting: Vascular Surgery

## 2016-03-29 ENCOUNTER — Ambulatory Visit (INDEPENDENT_AMBULATORY_CARE_PROVIDER_SITE_OTHER): Payer: Medicare Other | Admitting: Vascular Surgery

## 2016-03-29 VITALS — BP 125/73 | HR 87 | Temp 97.1°F | Resp 16 | Ht 66.0 in | Wt 162.0 lb

## 2016-03-29 DIAGNOSIS — I83891 Varicose veins of right lower extremities with other complications: Secondary | ICD-10-CM

## 2016-03-29 NOTE — Progress Notes (Signed)
Subjective:     Patient ID: Haley AzureLinda T Fuller, female   DOB: 14-Jul-1940, 75 y.o.   MRN: [id number]  HPI This 75 year old female had multiple stab phlebectomy-greater than 20 of painful varicosities in the right leg performed under local tumescent anesthesia. She tolerated the procedure well.  Review of Systems     Objective:   Physical Exam BP 125/73 (BP Location: Left Arm, Patient Position: Sitting, Cuff Size: Normal)   Pulse 87   Temp 97.1 F (36.2 C)   Resp 16   Ht 5\' 6"  (1.676 m)   Wt 162 lb (73.5 kg)   SpO2 97%   BMI 26.15 kg/m        Assessment:     Well-tolerated multiple stab phlebectomy of painful varicosities right leg performed under local tumescent anesthesia    Plan:     Return in 2 weeks for similar procedure on contralateral left leg

## 2016-03-29 NOTE — Progress Notes (Signed)
    Stab Phlebectomy Procedure  Haley AzureLinda T Fuller DOB:Jan 01, 1941  03/29/2016  Consent signed: Yes  Surgeon:J.D. Hart RochesterLawson  Procedure: stab phlebectomy: right leg  BP 125/73 (BP Location: Left Arm, Patient Position: Sitting, Cuff Size: Normal)   Pulse 87   Temp 97.1 F (36.2 C)   Resp 16   Ht 5\' 6"  (1.676 m)   Wt 162 lb (73.5 kg)   SpO2 97%   BMI 26.15 kg/m   Start time: 1:10   End time: 2:05   Tumescent Anesthesia: 300 cc 0.9% NaCl with 50 cc Lidocaine HCL with 1% Epi and 15 cc 8.4% NaHCO3  Local Anesthesia: 6 cc Lidocaine HCL and NaHCO3 (ratio 2:1)   Stab Phlebectomy: >20 Sites: Thigh, Calf and Ankle  Patient tolerated procedure well: Yes  Notes:   Description of Procedure:  After marking the course of the secondary varicosities, the patient was placed on the operating table in the supine position, and the right leg was prepped and draped in sterile fashion.    The patient was then put into Trendelenburg position.  Local anesthetic was administered at the previously marked varicosities, and tumescent anesthesia was administered around the vessels.  Greater than 20 stab wounds were made using the tip of an 11 blade. And using the vein hook, the phlebectomies were performed using a hemostat to avulse the varicosities.  Adequate hemostasis was achieved, and steri strips were applied to the stab wound.      ABD pads and thigh high compression stockings were applied as well ace wraps where needed. Blood loss was less than 15 cc.  The patient ambulated out of the operating room having tolerated the procedure well.

## 2016-03-30 ENCOUNTER — Telehealth: Payer: Self-pay | Admitting: *Deleted

## 2016-03-30 ENCOUNTER — Encounter: Payer: Self-pay | Admitting: Vascular Surgery

## 2016-03-30 NOTE — Telephone Encounter (Signed)
Pt doing well. No questions or concerns. 

## 2016-04-06 ENCOUNTER — Encounter: Payer: Self-pay | Admitting: Vascular Surgery

## 2016-04-12 ENCOUNTER — Ambulatory Visit (INDEPENDENT_AMBULATORY_CARE_PROVIDER_SITE_OTHER): Payer: Medicare Other | Admitting: Vascular Surgery

## 2016-04-12 ENCOUNTER — Encounter: Payer: Self-pay | Admitting: Vascular Surgery

## 2016-04-12 VITALS — BP 127/86 | HR 89 | Temp 98.0°F | Resp 16 | Ht 66.0 in | Wt 163.0 lb

## 2016-04-12 DIAGNOSIS — I83892 Varicose veins of left lower extremities with other complications: Secondary | ICD-10-CM | POA: Diagnosis not present

## 2016-04-12 NOTE — Progress Notes (Signed)
    Stab Phlebectomy Procedure  Haley AzureLinda T Fuller DOB:1940-08-02  04/12/2016  Consent signed: Yes  Surgeon:J.D. Hart RochesterLawson  Procedure: stab phlebectomy: left leg  BP 127/86   Pulse 89   Temp 98 F (36.7 C)   Resp 16   Ht 5\' 6"  (1.676 m)   Wt 163 lb (73.9 kg)   SpO2 99%   BMI 26.31 kg/m   Start time: 11am   End time: 12noon   Tumescent Anesthesia: 200 cc 0.9% NaCl with 50 cc Lidocaine HCL with 1% Epi and 15 cc 8.4% NaHCO3  Local Anesthesia: 5 cc Lidocaine HCL and NaHCO3 (ratio 2:1)    Stab Phlebectomy: >20 Sites: Thigh and Calf  Patient tolerated procedure well: Yes  Notes:   Description of Procedure:  After marking the course of the secondary varicosities, the patient was placed on the operating table in the supine position, and the left leg was prepped and draped in sterile fashion.    The patient was then put into Trendelenburg position.  Local anesthetic was administered at the previously marked varicosities, and tumescent anesthesia was administered around the vessels.  Greater than 20 stab wounds were made using the tip of an 11 blade. And using the vein hook, the phlebectomies were performed using a hemostat to avulse the varicosities.  Adequate hemostasis was achieved, and steri strips were applied to the stab wound.      ABD pads and thigh high compression stockings were applied as well ace wraps where needed. Blood loss was less than 15 cc.  The patient ambulated out of the operating room having tolerated the procedure well.

## 2016-04-12 NOTE — Progress Notes (Signed)
Subjective:     Patient ID: Haley Fuller, female   DOB: 1941-03-28, 75 y.o.   MRN: [id number]  HPI This 75 year old female had multiple stab phlebectomy of painful varicosities in the left leg performed under local tumescent anesthesia-greater than 20. She tolerated the procedure well.  Review of Systems BP 127/86   Pulse 89   Temp 98 F (36.7 C)   Resp 16   Ht 5\' 6"  (1.676 m)   Wt 163 lb (73.9 kg)   SpO2 99%   BMI 26.31 kg/m       Objective:   Physical Exam .vsa    Assessment:     Well-tolerated multiple stab phlebectomy of painful varicosities left leg performed under local tumescent anesthesia    Plan:     Patient will have 2 sessions of foam sclerotherapy to be performed in the near future and then return on a when necessary basis

## 2016-04-13 ENCOUNTER — Telehealth: Payer: Self-pay | Admitting: *Deleted

## 2016-04-13 NOTE — Telephone Encounter (Signed)
Pt doing well. No bleeding. Following all instructions. 

## 2016-04-15 ENCOUNTER — Encounter: Payer: Self-pay | Admitting: Vascular Surgery

## 2016-06-02 ENCOUNTER — Ambulatory Visit: Payer: Medicare Other | Admitting: *Deleted

## 2016-06-24 ENCOUNTER — Encounter: Payer: Self-pay | Admitting: *Deleted

## 2016-06-30 ENCOUNTER — Ambulatory Visit (INDEPENDENT_AMBULATORY_CARE_PROVIDER_SITE_OTHER): Payer: Medicare Other | Admitting: *Deleted

## 2016-06-30 DIAGNOSIS — I83893 Varicose veins of bilateral lower extremities with other complications: Secondary | ICD-10-CM | POA: Diagnosis not present

## 2016-06-30 NOTE — Progress Notes (Signed)
X=.3 Sotradecol administered with a 27g butterfly.  Patient received a total of 24cc.  Treated 90% of spider veins. Easy access. Tol well. Anticipate good results. She has one more ins covered tx in Jan. Follow prn.    Compression stockings applied: Yes.

## 2016-07-01 ENCOUNTER — Encounter: Payer: Self-pay | Admitting: Vascular Surgery

## 2016-07-22 ENCOUNTER — Encounter: Payer: Self-pay | Admitting: *Deleted

## 2016-07-28 ENCOUNTER — Ambulatory Visit (INDEPENDENT_AMBULATORY_CARE_PROVIDER_SITE_OTHER): Payer: Medicare Other | Admitting: *Deleted

## 2016-07-28 DIAGNOSIS — I83893 Varicose veins of bilateral lower extremities with other complications: Secondary | ICD-10-CM | POA: Diagnosis not present

## 2016-07-28 NOTE — Progress Notes (Signed)
X=.3% Sotradecol administered with a 27g butterfly.  Patient received a total of 6cc.  Injected any remaining open spiders on front of both calves. Easy access. Tol well. Previously treated areas are resolving well. One area on right calf has phlebitis. Told her not to worry buy apply heat, arnicare, and take Ibuprofen after the 2 weeks of stockings if it is still present.  Will follow prn.   Compression stockings applied: Yes.

## 2016-09-01 ENCOUNTER — Encounter: Payer: Self-pay | Admitting: Podiatry

## 2016-09-01 ENCOUNTER — Ambulatory Visit (INDEPENDENT_AMBULATORY_CARE_PROVIDER_SITE_OTHER): Payer: Medicare Other | Admitting: Podiatry

## 2016-09-01 VITALS — BP 126/79 | HR 81 | Ht 66.5 in | Wt 160.0 lb

## 2016-09-01 DIAGNOSIS — L851 Acquired keratosis [keratoderma] palmaris et plantaris: Secondary | ICD-10-CM

## 2016-09-01 DIAGNOSIS — M79672 Pain in left foot: Secondary | ICD-10-CM

## 2016-09-01 DIAGNOSIS — Q828 Other specified congenital malformations of skin: Secondary | ICD-10-CM | POA: Diagnosis not present

## 2016-09-01 DIAGNOSIS — M79671 Pain in right foot: Secondary | ICD-10-CM | POA: Diagnosis not present

## 2016-09-01 DIAGNOSIS — L84 Corns and callosities: Secondary | ICD-10-CM

## 2016-09-01 NOTE — Progress Notes (Signed)
   Subjective: Patient presents to the office today for chief complaint of painful callus lesions of the feet. Patient states that the pain is ongoing and is affecting their ability to ambulate without pain. Patient presents today for further treatment and evaluation.  Objective:  Physical Exam General: Alert and oriented x3 in no acute distress  Dermatology: Hyperkeratotic lesion present on the bilateral feet 6. Pain on palpation with a central nucleated core noted.  Skin is warm, dry and supple bilateral lower extremities. Negative for open lesions or macerations.  Vascular: Palpable pedal pulses bilaterally. No edema or erythema noted. Capillary refill within normal limits.  Neurological: Epicritic and protective threshold grossly intact bilaterally.   Musculoskeletal Exam: Pain on palpation at the keratotic lesion noted. Range of motion within normal limits bilateral. Muscle strength 5/5 in all groups bilateral.  Assessment: #1 multiple porokeratosis bilateral feet 6   Plan of Care:  #1 Patient evaluated #2 Excisional debridement of  keratoic lesion using a chisel blade was performed without incident.  #3 Treated area(s) with Salinocaine and dressed with light dressing. #4 Patient is to return to the clinic PRN.   Felecia ShellingBrent M. Stefanee Mckell, DPM [facility]  Dr. Felecia ShellingBrent M. Tiersa Dayley, DPM    [address]                                        [place], [place] [address]                Office [phone]  Fax [phone]

## 2016-10-07 ENCOUNTER — Other Ambulatory Visit: Payer: Self-pay | Admitting: Internal Medicine

## 2016-10-07 DIAGNOSIS — Z1231 Encounter for screening mammogram for malignant neoplasm of breast: Secondary | ICD-10-CM

## 2016-10-29 ENCOUNTER — Ambulatory Visit
Admission: RE | Admit: 2016-10-29 | Discharge: 2016-10-29 | Disposition: A | Discharge status: Home / Self Care | Payer: Medicare Other | Source: Ambulatory Visit | Attending: Internal Medicine | Admitting: Internal Medicine

## 2016-10-29 DIAGNOSIS — Z1231 Encounter for screening mammogram for malignant neoplasm of breast: Secondary | ICD-10-CM

## 2017-08-17 ENCOUNTER — Ambulatory Visit: Payer: Medicare Other | Admitting: Podiatry

## 2017-08-17 ENCOUNTER — Encounter: Payer: Self-pay | Admitting: Internal Medicine

## 2017-09-27 ENCOUNTER — Other Ambulatory Visit: Payer: Self-pay

## 2017-10-06 ENCOUNTER — Ambulatory Visit: Payer: Medicare Other | Admitting: Internal Medicine

## 2017-10-06 ENCOUNTER — Encounter (INDEPENDENT_AMBULATORY_CARE_PROVIDER_SITE_OTHER): Payer: Self-pay

## 2017-10-06 ENCOUNTER — Encounter: Payer: Self-pay | Admitting: Internal Medicine

## 2017-10-06 VITALS — BP 144/84 | HR 70 | Ht 66.0 in | Wt 163.0 lb

## 2017-10-06 DIAGNOSIS — Z860100 Personal history of colon polyps, unspecified: Secondary | ICD-10-CM

## 2017-10-06 NOTE — Progress Notes (Addendum)
Haley AzureLinda T Fuller 77 y.o. February 14, 1941 [id number]  Assessment & Plan:   Encounter Diagnosis  Name Primary?  . History of colonic polyps Yes    At this point the polyps removed in 2014t were not precancerous and she would not be indicated to have a colonoscopy.  She has had prior colonoscopies which could have some bearing upon this.  I will request the complete records and determine if it is reasonable and appropriate to repeat a colonoscopy..  I will let her know.  I told her to call me back if she is not heard by the end of April.  Annual Hemoccults are optional at best in her case and probably not really necessary.  I appreciate the opportunity to care for this patient. CC: Martha ClanShaw, William, MD   Colonoscopy report reviewed - indication was screening and family history of colon polyps or cancer (but we do not have a family hx reported).  Thus next repeat colonoscopy would be 10 years after last and would be in 2024 when she is 89 so I do not recommend routine repeat colonoscopy but would be happy to discuss when or if she reaches age 77.    Subjective:   Chief Complaint: History of colon polyps  HPI The patient is a very nice widowed 77 year old white woman with a history of colon polyps, last having had a colonoscopy in February 2014 at [facility].  She received a letter saying that she was due for a colonoscopy.  She believes but is not certain that that the only time she said colon polyps and she has had 1 or 2 other colonoscopy procedures.  She is referred for consideration of colonoscopy.  Review of records in care everywhere shows that she had a sigmoid benign mucosal polyp with intramucosal lymphoid nodules, and the same type of polyp removed from the ascending colon.  No adenomas or sessile serrated polyps.  GI review of systems is also notable for pill dysphagia to glucosamine.  Occasional heartburn.  Other than that she seems to be doing well.  Bowel habits  are regular there is no rectal bleeding or other difficulty. She also reports that she does annual Hemoccults at Dr. Alver FisherShaw's office and none of those have been positive.  CBC was normal in July 2018  No Known Allergies Current Meds  Medication Sig  . aspirin 81 MG tablet Take 81 mg by mouth every other day.  . budesonide-formoterol (SYMBICORT) 160-4.5 MCG/ACT inhaler Inhale 2 puffs into the lungs 2 (two) times daily.  Marland Kitchen. ezetimibe (ZETIA) 10 MG tablet Take 10 mg by mouth daily.  Marland Kitchen. losartan (COZAAR) 50 MG tablet Take 50 mg by mouth 2 (two) times daily.  . Misc Natural Products (OSTEO BI-FLEX JOINT SHIELD PO) Take by mouth.  . Probiotic Product (PROBIOTIC DAILY PO) Take by mouth daily. Reported on 11/24/2015  . venlafaxine XR (EFFEXOR-XR) 75 MG 24 hr capsule Take 75 mg by mouth 2 (two) times daily.   Past Medical History:  Diagnosis Date  . Asthma   . Bilateral ankle fractures 2005  . Depression   . Hyperlipidemia   . Hypertension   . IFG (impaired fasting glucose)   . Lichen planus   . OA (osteoarthritis)    hands  . Thrombophlebitis leg superficial    right leg  . Trigeminal neuralgia   . Varicose veins    Past Surgical History:  Procedure Laterality Date  . ABDOMINAL HYSTERECTOMY     partial  .  COLONOSCOPY     Last in 2014   Social History   Social History Narrative   She is a widow, her husband was a Optician, dispensing.  She has 2 sons born 80 and 59 and a daughter born in 71.  1 of her sons is a Arboriculturist [place]      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      No alcohol tobacco or drug use.  She has 4 glasses of tea or cups of tea a day.   family history includes Cancer in her father; Stroke in her mother.   Review of Systems As per HPI.  All other review of systems appear negative at this time.  She does think glucosamine chondroitin helps her knees.  Objective:   Physical  Exam @BP  (!) 144/84   Pulse 70   Ht 5\' 6"  (1.676 m)   Wt 163 lb (73.9 kg)   BMI 26.31 kg/m @  General:  Well-developed, well-nourished and in no acute distress Eyes:  anicteric. Lungs: Clear to auscultation bilaterally. Heart:  S1S2, no rubs, murmurs, gallops. Abdomen:  soft, non-tender, no hepatosplenomegaly, hernia, or mass and BS+.  Neuro:  A&O x 3.    Data Reviewed: See HPI.  20 minutes time spent w/ patient >half counseling/coord of care

## 2017-10-06 NOTE — Patient Instructions (Addendum)
We are going to get your records for review from [facility] .     I appreciate the opportunity to care for you. Stan Headarl Gessner, MD, [facility]

## 2017-10-11 ENCOUNTER — Encounter: Payer: Self-pay | Admitting: Neurology

## 2017-10-11 ENCOUNTER — Ambulatory Visit: Payer: Medicare Other | Admitting: Neurology

## 2017-10-11 VITALS — BP 144/86 | HR 79 | Ht 66.0 in | Wt 164.0 lb

## 2017-10-11 DIAGNOSIS — G500 Trigeminal neuralgia: Secondary | ICD-10-CM | POA: Diagnosis not present

## 2017-10-11 NOTE — Progress Notes (Signed)
PATIENT: Haley Fuller DOB: Jan 31, 1941  Chief Complaint  Patient presents with  . Trigeminal Neuralgia    She is here for further evaluation of her intermittent, right-sided facial pain. She has tried Lyrica and amitriptyline in the past.  She felt both medications caused problems with her Effexor and her depression became worse.  Marland Kitchen PCP    Martha Clan, MD     HISTORICAL  Haley Fuller is a 77 year old female, seen in refer by her primary care physician Dr. Martha Clan for evaluation of right trigeminal neuralgia.  Initial evaluation is on October 11 2017.  She has past medical history of hypertension, depression, taking Effexor 75 mg twice a day, Wellbutrin 150 mg as needed,  Starting in November 2018, he noticed the pain at the right lower molar, initially she thought it was a dental problem, was seen by endodontist, had a root canal, taking out, work, she continues to have radiating pain starting from right lower to right ear, sometimes to the right cheek, right lateral nasal ala,  At the first few weeks, the pain is so severe, present more than 50% of the day, she reported some relief by biting on toothbrush, over the past few weeks, her right facial pain has much improved.  She was giving a short trial of Lyrica 75 mg twice a day, complains of intolerable side effect of dizziness, along with her Effexor, also worsening " feeling down", she was also given a prescription of nortriptyline, just tried 25 mg at one night, complains of sleepiness,  Now with her improved right facial pain, she does not want to try any new medication, initiate any evaluation such as MRI of the brain at this point,  REVIEW OF SYSTEMS: Full 14 system review of systems performed and notable only for allergies, runny nose, easy bruising, bleeding.  ALLERGIES: No Known Allergies  HOME MEDICATIONS: Current Outpatient Medications  Medication Sig Dispense Refill  . budesonide-formoterol (SYMBICORT) 160-4.5  MCG/ACT inhaler Inhale 2 puffs into the lungs 2 (two) times daily as needed.     Marland Kitchen buPROPion (WELLBUTRIN XL) 150 MG 24 hr tablet Take 150 mg by mouth daily as needed.     . ezetimibe (ZETIA) 10 MG tablet Take 10 mg by mouth daily.    Marland Kitchen glucosamine-chondroitin 500-400 MG tablet Take 1 tablet by mouth daily.    . hydrochlorothiazide (HYDRODIURIL) 12.5 MG tablet Take 12.5 mg by mouth daily.  11  . losartan (COZAAR) 50 MG tablet Take 50 mg by mouth 2 (two) times daily.    Marland Kitchen venlafaxine XR (EFFEXOR-XR) 75 MG 24 hr capsule Take 75 mg by mouth 2 (two) times daily.     No current facility-administered medications for this visit.     PAST MEDICAL HISTORY: Past Medical History:  Diagnosis Date  . Asthma   . Bilateral ankle fractures 2005  . Depression   . Hyperlipidemia   . Hypertension   . IFG (impaired fasting glucose)   . Lichen planus   . OA (osteoarthritis)    hands  . Thrombophlebitis leg superficial    right leg  . Trigeminal neuralgia   . Varicose veins     PAST SURGICAL HISTORY: Past Surgical History:  Procedure Laterality Date  . ABDOMINAL HYSTERECTOMY     partial  . COLONOSCOPY     Last in 2014    FAMILY HISTORY: Family History  Problem Relation Age of Onset  . Stroke Mother   . Cancer Father  bladder    SOCIAL HISTORY:  Social History   Socioeconomic History  . Marital status: Married    Spouse name: Not on file  . Number of children: 3  . Years of education: 6618  . Highest education level: Bachelor's degree (e.g., BA, AB, BS)  Occupational History  . Occupation: Retired  Engineer, productionocial Needs  . Financial resource strain: Not on file  . Food insecurity:    Worry: Not on file    Inability: Not on file  . Transportation needs:    Medical: Not on file    Non-medical: Not on file  Tobacco Use  . Smoking status: Never Smoker  . Smokeless tobacco: Never Used  Substance and Sexual Activity  . Alcohol use: No    Alcohol/week: 0.0 oz  . Drug use: No  .  Sexual activity: Not on file  Lifestyle  . Physical activity:    Days per week: Not on file    Minutes per session: Not on file  . Stress: Not on file  Relationships  . Social connections:    Talks on phone: Not on file    Gets together: Not on file    Attends religious service: Not on file    Active member of club or organization: Not on file    Attends meetings of clubs or organizations: Not on file    Relationship status: Not on file  . Intimate partner violence:    Fear of current or ex partner: Not on file    Emotionally abused: Not on file    Physically abused: Not on file    Forced sexual activity: Not on file  Other Topics Concern  . Not on file  Social History Narrative   She is a widow, her husband was a Optician, dispensingminister.  She has 2 sons born [id number] and [id number] and a daughter born in [id number].  1 of her sons is a Arboriculturistcardiologist [place]      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      No alcohol tobacco or drug use.  She has 4 glasses of tea or cups of tea a day.      Right-handed.     PHYSICAL EXAM   Vitals:   10/11/17 0753  BP: (!) 144/86  Pulse: 79  Weight: 164 lb (74.4 kg)  Height: 5\' 6"  (1.676 m)    Not recorded      Body mass index is 26.47 kg/m.  PHYSICAL EXAMNIATION:  Gen: NAD, conversant, well nourised, obese, well groomed                     Cardiovascular: Regular rate rhythm, no peripheral edema, warm, nontender. Eyes: Conjunctivae clear without exudates or hemorrhage Neck: Supple, no carotid bruits. Pulmonary: Clear to auscultation bilaterally   NEUROLOGICAL EXAM:  MENTAL STATUS: Speech:    Speech is normal; fluent and spontaneous with normal comprehension.  Cognition:     Orientation to time, place and person     Normal recent and remote memory     Normal Attention span and concentration     Normal Language, naming, repeating,spontaneous speech     Fund of  knowledge   CRANIAL NERVES: CN II: Visual fields are full to confrontation. Fundoscopic exam is normal with sharp discs and no vascular changes. Pupils are round equal and briskly reactive to light. CN III, IV, VI: extraocular movement  are normal. No ptosis. CN V: Facial sensation is intact to pinprick in all 3 divisions bilaterally. Corneal responses are intact.  CN VII: Face is symmetric with normal eye closure and smile. CN VIII: Hearing is normal to rubbing fingers CN IX, X: Palate elevates symmetrically. Phonation is normal. CN XI: Head turning and shoulder shrug are intact CN XII: Tongue is midline with normal movements and no atrophy.  MOTOR: There is no pronator drift of out-stretched arms. Muscle bulk and tone are normal. Muscle strength is normal.  REFLEXES: Reflexes are 2+ and symmetric at the biceps, triceps, knees, and ankles. Plantar responses are flexor.  SENSORY: Intact to light touch, pinprick, positional sensation and vibratory sensation are intact in fingers and toes.  COORDINATION: Rapid alternating movements and fine finger movements are intact. There is no dysmetria on finger-to-nose and heel-knee-shin.    GAIT/STANCE: Posture is normal. Gait is steady with normal steps, base, arm swing, and turning. Heel and toe walking are normal. Tandem gait is normal.  Romberg is absent.   DIAGNOSTIC DATA (LABS, IMAGING, TESTING) - I reviewed patient records, labs, notes, testing and imaging myself where available.   ASSESSMENT AND PLAN  Haley Fuller is a 77 y.o. female   Right trigeminal neuralgia  I have suggested MRI of the brain for evaluation, she wants to hold off at this point,  No longer have neuropathic pain, will not start any medication at this point  Levert Feinstein, M.D. Ph.D.  [facility] [address] [place], [place] [address] Ph: [phone] Fax: [phone]  CC: Martha Clan, MD

## 2017-10-17 ENCOUNTER — Telehealth: Payer: Self-pay

## 2017-10-17 NOTE — Telephone Encounter (Signed)
-----   Message from Iva Booparl E Gessner, MD sent at 10/17/2017  1:18 PM EDT ----- Regarding: colonoscopy records review Let her know that I reviewed colonoscopy report and there is nothing to suggest she needs a repeat now and would be due 2024. She will be 81 so aging out - can come back then if desired but no recall.

## 2017-10-17 NOTE — Telephone Encounter (Signed)
Left message on both cell and home #'s to call me back.

## 2017-10-18 NOTE — Telephone Encounter (Signed)
Patient informed and was happy to hear the news. She will call us back if she has any problems.

## 2019-02-21 ENCOUNTER — Other Ambulatory Visit: Payer: Self-pay | Admitting: Internal Medicine

## 2019-02-21 DIAGNOSIS — Z1231 Encounter for screening mammogram for malignant neoplasm of breast: Secondary | ICD-10-CM

## 2019-04-06 ENCOUNTER — Ambulatory Visit
Admission: RE | Admit: 2019-04-06 | Discharge: 2019-04-06 | Disposition: A | Discharge status: Home / Self Care | Payer: Medicare Other | Source: Ambulatory Visit | Attending: Internal Medicine | Admitting: Internal Medicine

## 2019-04-06 ENCOUNTER — Other Ambulatory Visit: Payer: Self-pay

## 2019-04-06 DIAGNOSIS — Z1231 Encounter for screening mammogram for malignant neoplasm of breast: Secondary | ICD-10-CM

## 2020-01-28 DIAGNOSIS — H10013 Acute follicular conjunctivitis, bilateral: Secondary | ICD-10-CM | POA: Diagnosis not present

## 2020-02-28 DIAGNOSIS — M859 Disorder of bone density and structure, unspecified: Secondary | ICD-10-CM | POA: Diagnosis not present

## 2020-02-28 DIAGNOSIS — M8580 Other specified disorders of bone density and structure, unspecified site: Secondary | ICD-10-CM | POA: Diagnosis not present

## 2020-02-28 DIAGNOSIS — E785 Hyperlipidemia, unspecified: Secondary | ICD-10-CM | POA: Diagnosis not present

## 2020-03-03 ENCOUNTER — Other Ambulatory Visit: Payer: Self-pay | Admitting: Internal Medicine

## 2020-03-03 DIAGNOSIS — Z0000 Encounter for general adult medical examination without abnormal findings: Secondary | ICD-10-CM

## 2020-03-06 DIAGNOSIS — M8580 Other specified disorders of bone density and structure, unspecified site: Secondary | ICD-10-CM | POA: Diagnosis not present

## 2020-03-06 DIAGNOSIS — J42 Unspecified chronic bronchitis: Secondary | ICD-10-CM | POA: Diagnosis not present

## 2020-03-06 DIAGNOSIS — J45909 Unspecified asthma, uncomplicated: Secondary | ICD-10-CM | POA: Diagnosis not present

## 2020-03-06 DIAGNOSIS — I10 Essential (primary) hypertension: Secondary | ICD-10-CM | POA: Diagnosis not present

## 2020-03-06 DIAGNOSIS — E785 Hyperlipidemia, unspecified: Secondary | ICD-10-CM | POA: Diagnosis not present

## 2020-03-06 DIAGNOSIS — Z0000 Encounter for general adult medical examination without abnormal findings: Secondary | ICD-10-CM | POA: Diagnosis not present

## 2020-03-06 DIAGNOSIS — D692 Other nonthrombocytopenic purpura: Secondary | ICD-10-CM | POA: Diagnosis not present

## 2020-03-06 DIAGNOSIS — F3341 Major depressive disorder, recurrent, in partial remission: Secondary | ICD-10-CM | POA: Diagnosis not present

## 2020-03-06 DIAGNOSIS — G250 Essential tremor: Secondary | ICD-10-CM | POA: Diagnosis not present

## 2020-03-06 DIAGNOSIS — Z1212 Encounter for screening for malignant neoplasm of rectum: Secondary | ICD-10-CM | POA: Diagnosis not present

## 2020-03-06 DIAGNOSIS — R82998 Other abnormal findings in urine: Secondary | ICD-10-CM | POA: Diagnosis not present

## 2020-04-07 ENCOUNTER — Other Ambulatory Visit: Payer: Self-pay

## 2020-04-07 ENCOUNTER — Ambulatory Visit
Admission: RE | Admit: 2020-04-07 | Discharge: 2020-04-07 | Disposition: A | Discharge status: Home / Self Care | Payer: Medicare PPO | Source: Ambulatory Visit | Attending: Internal Medicine | Admitting: Internal Medicine

## 2020-04-07 DIAGNOSIS — Z0000 Encounter for general adult medical examination without abnormal findings: Secondary | ICD-10-CM

## 2020-04-07 DIAGNOSIS — Z1231 Encounter for screening mammogram for malignant neoplasm of breast: Secondary | ICD-10-CM | POA: Diagnosis not present

## 2020-04-25 DIAGNOSIS — J069 Acute upper respiratory infection, unspecified: Secondary | ICD-10-CM | POA: Diagnosis not present

## 2020-04-25 DIAGNOSIS — Z1152 Encounter for screening for COVID-19: Secondary | ICD-10-CM | POA: Diagnosis not present

## 2020-04-25 DIAGNOSIS — R059 Cough, unspecified: Secondary | ICD-10-CM | POA: Diagnosis not present

## 2020-04-29 DIAGNOSIS — H35031 Hypertensive retinopathy, right eye: Secondary | ICD-10-CM | POA: Diagnosis not present

## 2020-04-29 DIAGNOSIS — H04123 Dry eye syndrome of bilateral lacrimal glands: Secondary | ICD-10-CM | POA: Diagnosis not present

## 2020-04-29 DIAGNOSIS — Z961 Presence of intraocular lens: Secondary | ICD-10-CM | POA: Diagnosis not present

## 2020-04-29 DIAGNOSIS — H3323 Serous retinal detachment, bilateral: Secondary | ICD-10-CM | POA: Diagnosis not present

## 2020-06-11 DIAGNOSIS — N6489 Other specified disorders of breast: Secondary | ICD-10-CM | POA: Diagnosis not present

## 2020-06-11 DIAGNOSIS — Z23 Encounter for immunization: Secondary | ICD-10-CM | POA: Diagnosis not present

## 2020-06-11 DIAGNOSIS — F3341 Major depressive disorder, recurrent, in partial remission: Secondary | ICD-10-CM | POA: Diagnosis not present

## 2020-06-11 DIAGNOSIS — G3184 Mild cognitive impairment, so stated: Secondary | ICD-10-CM | POA: Diagnosis not present

## 2020-06-16 ENCOUNTER — Other Ambulatory Visit: Payer: Self-pay | Admitting: Internal Medicine

## 2020-06-16 DIAGNOSIS — N6489 Other specified disorders of breast: Secondary | ICD-10-CM

## 2020-07-29 ENCOUNTER — Other Ambulatory Visit: Payer: Medicare PPO

## 2020-08-04 ENCOUNTER — Ambulatory Visit
Admission: RE | Admit: 2020-08-04 | Discharge: 2020-08-04 | Disposition: A | Discharge status: Home / Self Care | Payer: Medicare PPO | Source: Ambulatory Visit | Attending: Internal Medicine | Admitting: Internal Medicine

## 2020-08-04 ENCOUNTER — Other Ambulatory Visit: Payer: Self-pay

## 2020-08-04 DIAGNOSIS — R928 Other abnormal and inconclusive findings on diagnostic imaging of breast: Secondary | ICD-10-CM | POA: Diagnosis not present

## 2020-08-04 DIAGNOSIS — N6489 Other specified disorders of breast: Secondary | ICD-10-CM

## 2020-08-11 DIAGNOSIS — R413 Other amnesia: Secondary | ICD-10-CM | POA: Diagnosis not present

## 2020-08-11 DIAGNOSIS — F32A Depression, unspecified: Secondary | ICD-10-CM | POA: Diagnosis not present

## 2020-08-27 DIAGNOSIS — R413 Other amnesia: Secondary | ICD-10-CM | POA: Diagnosis not present

## 2020-09-01 DIAGNOSIS — F331 Major depressive disorder, recurrent, moderate: Secondary | ICD-10-CM | POA: Diagnosis not present

## 2020-09-01 DIAGNOSIS — Z658 Other specified problems related to psychosocial circumstances: Secondary | ICD-10-CM | POA: Diagnosis not present

## 2020-09-03 ENCOUNTER — Other Ambulatory Visit: Payer: Medicare PPO

## 2020-10-23 DIAGNOSIS — R319 Hematuria, unspecified: Secondary | ICD-10-CM | POA: Diagnosis not present

## 2020-10-24 ENCOUNTER — Ambulatory Visit: Payer: Medicare PPO | Admitting: Podiatry

## 2020-10-24 ENCOUNTER — Other Ambulatory Visit: Payer: Self-pay

## 2020-10-24 DIAGNOSIS — Q828 Other specified congenital malformations of skin: Secondary | ICD-10-CM | POA: Diagnosis not present

## 2020-10-29 ENCOUNTER — Encounter: Payer: Self-pay | Admitting: Podiatry

## 2020-10-29 NOTE — Progress Notes (Signed)
  Subjective:  Patient ID: Haley Fuller, female    DOB: [birth year],  MRN: [id number]  Chief Complaint  Patient presents with  . Callouses    80 y.o. female presents with the above complaint.  Patient presents with complaint bilateral submetatarsal 5 porokeratosis.  They are painful to touch painful when walking on them.  Patient has tried some over-the-counter medication none of which has helped.  She would like to discuss treatment options for this.  She has not seen anyone else prior to seeing me.  She denies any other acute complaints.  She has not tried offloading it.  She wears regular shoes.   Review of Systems: Negative except as noted in the HPI. Denies N/V/F/Ch.  Past Medical History:  Diagnosis Date  . Asthma   . Bilateral ankle fractures 2005  . Depression   . Hyperlipidemia   . Hypertension   . IFG (impaired fasting glucose)   . Lichen planus   . OA (osteoarthritis)    hands  . Thrombophlebitis leg superficial    right leg  . Trigeminal neuralgia   . Varicose veins     Current Outpatient Medications:  .  budesonide-formoterol (SYMBICORT) 160-4.5 MCG/ACT inhaler, Inhale 2 puffs into the lungs 2 (two) times daily as needed. , Disp: , Rfl:  .  buPROPion (WELLBUTRIN XL) 150 MG 24 hr tablet, Take 150 mg by mouth daily as needed. , Disp: , Rfl:  .  ezetimibe (ZETIA) 10 MG tablet, Take 10 mg by mouth daily., Disp: , Rfl:  .  glucosamine-chondroitin 500-400 MG tablet, Take 1 tablet by mouth daily., Disp: , Rfl:  .  hydrochlorothiazide (HYDRODIURIL) 12.5 MG tablet, Take 12.5 mg by mouth daily., Disp: , Rfl: 11 .  losartan (COZAAR) 50 MG tablet, Take 50 mg by mouth 2 (two) times daily., Disp: , Rfl:  .  venlafaxine XR (EFFEXOR-XR) 75 MG 24 hr capsule, Take 75 mg by mouth 2 (two) times daily., Disp: , Rfl:   Social History   Tobacco Use  Smoking Status Never Smoker  Smokeless Tobacco Never Used    No Known Allergies Objective:  There were no vitals filed for this  visit. There is no height or weight on file to calculate BMI. Constitutional Well developed. Well nourished.  Vascular Dorsalis pedis pulses palpable bilaterally. Posterior tibial pulses palpable bilaterally. Capillary refill normal to all digits.  No cyanosis or clubbing noted. Pedal hair growth normal.  Neurologic Normal speech. Oriented to person, place, and time. Epicritic sensation to light touch grossly present bilaterally.  Dermatologic  hyperkeratotic lesion with central nucleated core noted to bilateral submetatarsal 5.  Pain on palpation to the lesion.  No pinpoint bleeding noted.  Orthopedic: Normal joint ROM without pain or crepitus bilaterally. No visible deformities. No bony tenderness.   Radiographs: None Assessment:   1. Porokeratosis    Plan:  Patient was evaluated and treated and all questions answered.  Bilateral submetatarsal 5 porokeratosis x2 -I explained the patient the etiology of porokeratosis and various treatment options were discussed.  Given amount of pain she is having I believe patient would benefit from debridement of the lesion -Using chisel blade to handle the lesion was debrided down to healthy striated tissue no complication noted no pinpoint bleeding noted. -Offloading pads were dispensed  No follow-ups on file.

## 2020-10-30 DIAGNOSIS — G4709 Other insomnia: Secondary | ICD-10-CM | POA: Diagnosis not present

## 2020-10-30 DIAGNOSIS — F331 Major depressive disorder, recurrent, moderate: Secondary | ICD-10-CM | POA: Diagnosis not present

## 2020-11-30 DIAGNOSIS — M791 Myalgia, unspecified site: Secondary | ICD-10-CM | POA: Diagnosis not present

## 2020-11-30 DIAGNOSIS — R110 Nausea: Secondary | ICD-10-CM | POA: Diagnosis not present

## 2020-11-30 DIAGNOSIS — R52 Pain, unspecified: Secondary | ICD-10-CM | POA: Diagnosis not present

## 2020-11-30 DIAGNOSIS — W57XXXA Bitten or stung by nonvenomous insect and other nonvenomous arthropods, initial encounter: Secondary | ICD-10-CM | POA: Diagnosis not present

## 2020-12-01 DIAGNOSIS — M791 Myalgia, unspecified site: Secondary | ICD-10-CM | POA: Diagnosis not present

## 2020-12-01 DIAGNOSIS — W57XXXA Bitten or stung by nonvenomous insect and other nonvenomous arthropods, initial encounter: Secondary | ICD-10-CM | POA: Diagnosis not present

## 2020-12-01 DIAGNOSIS — W460XXA Contact with hypodermic needle, initial encounter: Secondary | ICD-10-CM | POA: Diagnosis not present

## 2020-12-05 ENCOUNTER — Ambulatory Visit: Payer: Medicare PPO | Admitting: Podiatry

## 2020-12-24 DIAGNOSIS — F331 Major depressive disorder, recurrent, moderate: Secondary | ICD-10-CM | POA: Diagnosis not present

## 2021-01-12 DIAGNOSIS — R42 Dizziness and giddiness: Secondary | ICD-10-CM | POA: Diagnosis not present

## 2021-01-12 DIAGNOSIS — M7989 Other specified soft tissue disorders: Secondary | ICD-10-CM | POA: Diagnosis not present

## 2021-01-12 DIAGNOSIS — R5383 Other fatigue: Secondary | ICD-10-CM | POA: Diagnosis not present

## 2021-01-12 DIAGNOSIS — I10 Essential (primary) hypertension: Secondary | ICD-10-CM | POA: Diagnosis not present

## 2021-01-12 DIAGNOSIS — L0889 Other specified local infections of the skin and subcutaneous tissue: Secondary | ICD-10-CM | POA: Diagnosis not present

## 2021-01-12 DIAGNOSIS — M2550 Pain in unspecified joint: Secondary | ICD-10-CM | POA: Diagnosis not present

## 2021-02-16 DIAGNOSIS — N390 Urinary tract infection, site not specified: Secondary | ICD-10-CM | POA: Diagnosis not present

## 2021-02-16 DIAGNOSIS — R8290 Unspecified abnormal findings in urine: Secondary | ICD-10-CM | POA: Diagnosis not present

## 2021-03-16 DIAGNOSIS — E785 Hyperlipidemia, unspecified: Secondary | ICD-10-CM | POA: Diagnosis not present

## 2021-03-16 DIAGNOSIS — M859 Disorder of bone density and structure, unspecified: Secondary | ICD-10-CM | POA: Diagnosis not present

## 2021-03-16 DIAGNOSIS — I10 Essential (primary) hypertension: Secondary | ICD-10-CM | POA: Diagnosis not present

## 2021-03-19 ENCOUNTER — Other Ambulatory Visit: Payer: Self-pay | Admitting: Internal Medicine

## 2021-03-19 DIAGNOSIS — Z1231 Encounter for screening mammogram for malignant neoplasm of breast: Secondary | ICD-10-CM

## 2021-03-23 DIAGNOSIS — Z1389 Encounter for screening for other disorder: Secondary | ICD-10-CM | POA: Diagnosis not present

## 2021-03-23 DIAGNOSIS — F3341 Major depressive disorder, recurrent, in partial remission: Secondary | ICD-10-CM | POA: Diagnosis not present

## 2021-03-23 DIAGNOSIS — E785 Hyperlipidemia, unspecified: Secondary | ICD-10-CM | POA: Diagnosis not present

## 2021-03-23 DIAGNOSIS — D692 Other nonthrombocytopenic purpura: Secondary | ICD-10-CM | POA: Diagnosis not present

## 2021-03-23 DIAGNOSIS — Z0000 Encounter for general adult medical examination without abnormal findings: Secondary | ICD-10-CM | POA: Diagnosis not present

## 2021-03-23 DIAGNOSIS — Z23 Encounter for immunization: Secondary | ICD-10-CM | POA: Diagnosis not present

## 2021-03-23 DIAGNOSIS — R82998 Other abnormal findings in urine: Secondary | ICD-10-CM | POA: Diagnosis not present

## 2021-03-23 DIAGNOSIS — I10 Essential (primary) hypertension: Secondary | ICD-10-CM | POA: Diagnosis not present

## 2021-03-23 DIAGNOSIS — Z1331 Encounter for screening for depression: Secondary | ICD-10-CM | POA: Diagnosis not present

## 2021-03-23 DIAGNOSIS — J42 Unspecified chronic bronchitis: Secondary | ICD-10-CM | POA: Diagnosis not present

## 2021-03-23 DIAGNOSIS — G3184 Mild cognitive impairment, so stated: Secondary | ICD-10-CM | POA: Diagnosis not present

## 2021-03-23 DIAGNOSIS — K219 Gastro-esophageal reflux disease without esophagitis: Secondary | ICD-10-CM | POA: Diagnosis not present

## 2021-03-23 DIAGNOSIS — M8580 Other specified disorders of bone density and structure, unspecified site: Secondary | ICD-10-CM | POA: Diagnosis not present

## 2021-03-23 DIAGNOSIS — R7401 Elevation of levels of liver transaminase levels: Secondary | ICD-10-CM | POA: Diagnosis not present

## 2021-03-26 DIAGNOSIS — F331 Major depressive disorder, recurrent, moderate: Secondary | ICD-10-CM | POA: Diagnosis not present

## 2021-04-22 ENCOUNTER — Other Ambulatory Visit: Payer: Self-pay

## 2021-04-22 ENCOUNTER — Ambulatory Visit
Admission: RE | Admit: 2021-04-22 | Discharge: 2021-04-22 | Disposition: A | Discharge status: Home / Self Care | Payer: Medicare PPO | Source: Ambulatory Visit | Attending: Internal Medicine | Admitting: Internal Medicine

## 2021-04-22 DIAGNOSIS — Z1231 Encounter for screening mammogram for malignant neoplasm of breast: Secondary | ICD-10-CM | POA: Diagnosis not present

## 2021-06-15 DIAGNOSIS — R5383 Other fatigue: Secondary | ICD-10-CM | POA: Diagnosis not present

## 2021-06-15 DIAGNOSIS — U071 COVID-19: Secondary | ICD-10-CM | POA: Diagnosis not present

## 2021-06-15 DIAGNOSIS — R300 Dysuria: Secondary | ICD-10-CM | POA: Diagnosis not present

## 2021-06-15 DIAGNOSIS — Z1152 Encounter for screening for COVID-19: Secondary | ICD-10-CM | POA: Diagnosis not present

## 2021-06-15 DIAGNOSIS — R0981 Nasal congestion: Secondary | ICD-10-CM | POA: Diagnosis not present

## 2021-06-15 DIAGNOSIS — R519 Headache, unspecified: Secondary | ICD-10-CM | POA: Diagnosis not present

## 2021-06-15 DIAGNOSIS — R509 Fever, unspecified: Secondary | ICD-10-CM | POA: Diagnosis not present

## 2021-06-15 DIAGNOSIS — R051 Acute cough: Secondary | ICD-10-CM | POA: Diagnosis not present

## 2021-09-02 DIAGNOSIS — I10 Essential (primary) hypertension: Secondary | ICD-10-CM | POA: Diagnosis not present

## 2021-09-02 DIAGNOSIS — F3341 Major depressive disorder, recurrent, in partial remission: Secondary | ICD-10-CM | POA: Diagnosis not present

## 2021-09-02 DIAGNOSIS — G500 Trigeminal neuralgia: Secondary | ICD-10-CM | POA: Diagnosis not present

## 2021-09-15 DIAGNOSIS — F331 Major depressive disorder, recurrent, moderate: Secondary | ICD-10-CM | POA: Diagnosis not present

## 2021-10-14 DIAGNOSIS — J45909 Unspecified asthma, uncomplicated: Secondary | ICD-10-CM | POA: Diagnosis not present

## 2021-10-14 DIAGNOSIS — R0609 Other forms of dyspnea: Secondary | ICD-10-CM | POA: Insufficient documentation

## 2021-10-14 DIAGNOSIS — I10 Essential (primary) hypertension: Secondary | ICD-10-CM | POA: Diagnosis not present

## 2021-10-14 DIAGNOSIS — J42 Unspecified chronic bronchitis: Secondary | ICD-10-CM | POA: Diagnosis not present

## 2021-10-14 NOTE — Progress Notes (Addendum)
? ? ?Patient referred by Ginger Organ., MD for exertional dyspnea ? ?Subjective:  ? ?Haley Fuller, female    DOB: 03-Nov-1940, 81 y.o.   MRN: [id number] ? ? ?Chief Complaint  ?Patient presents with  ? DOE  ? New Patient (Initial Visit)  ? ? ? ?HPI ? ?81 y.o. Caucasian female with hypertension, hyperlipidemia, h/o superficial thrombophlebitis, now with subacute onset of exertional dyspnea. ? ?[place], takes care of horses, chickens, dogs.  She is very active with outdoor activity.  Over the last couple of days, she has developed relatively subacute onset of exertional dyspnea with climbing up a flight of stairs.  She denies any chest pain.  She has never had this sort of dyspnea before.  She was seen by her PCP yesterday, and underwent chest x-ray, with no significant acute abnormality.  She denies orthopnea, PND, leg edema.  She does have unrelated postural dizziness and nausea symptoms for several years. ? ? ?Past Medical History:  ?Diagnosis Date  ? Asthma   ? Bilateral ankle fractures 2005  ? Depression   ? Hyperlipidemia   ? Hypertension   ? IFG (impaired fasting glucose)   ? Lichen planus   ? OA (osteoarthritis)   ? hands  ? Thrombophlebitis leg superficial   ? right leg  ? Trigeminal neuralgia   ? Varicose veins   ? ? ? ?Past Surgical History:  ?Procedure Laterality Date  ? ABDOMINAL HYSTERECTOMY    ? partial  ? COLONOSCOPY    ? Last in 2014  ? ? ? ?Social History  ? ?Tobacco Use  ?Smoking Status Never  ?Smokeless Tobacco Never  ? ? ?Social History  ? ?Substance and Sexual Activity  ?Alcohol Use No  ? Alcohol/week: 0.0 standard drinks  ? ? ? ?Family History  ?Problem Relation Age of Onset  ? Stroke Mother   ? Cancer Father   ?     bladder  ? Breast cancer Neg Hx   ? ? ? ? ?Current Outpatient Medications:  ?  budesonide-formoterol (SYMBICORT) 160-4.5 MCG/ACT inhaler, Inhale 2 puffs into the lungs 2 (two) times daily as needed. , Disp: , Rfl:  ?  buPROPion (WELLBUTRIN XL) 150 MG 24 hr  tablet, Take 150 mg by mouth daily as needed. , Disp: , Rfl:  ?  ezetimibe (ZETIA) 10 MG tablet, Take 10 mg by mouth daily., Disp: , Rfl:  ?  glucosamine-chondroitin 500-400 MG tablet, Take 1 tablet by mouth daily., Disp: , Rfl:  ?  hydrochlorothiazide (HYDRODIURIL) 12.5 MG tablet, Take 12.5 mg by mouth daily., Disp: , Rfl: 11 ?  losartan (COZAAR) 50 MG tablet, Take 50 mg by mouth 2 (two) times daily., Disp: , Rfl:  ?  venlafaxine XR (EFFEXOR-XR) 75 MG 24 hr capsule, Take 75 mg by mouth 2 (two) times daily., Disp: , Rfl:  ? ? ?Cardiovascular and other pertinent studies: ? ?Reviewed external labs and tests, independently interpreted ? ?EKG 10/15/2021: ?Sinus rhythm 81 bpm ?Low voltage in precordial leads ?Incomplete RBBB ?Left atrial enlargement  ? ?CXR 10/14/2021: ?No acute cardiopulmonary disease ?Hyperextension consistent with a given h/o reactive airway disease ?Atherosclerosis ?Thoracic dextroscoliosis ? ? ?Recent labs: ?03/2021: ?Glucose NA, BUN/Cr 15/NA. EGFR 109. Na/K NA/4.9. Rest of the CMP normal ?Chol 161, TG 45, HDL 68, LDL 84 ?TSH 2.3 normal ? ? ? ?Review of Systems  ?Cardiovascular:  Positive for dyspnea on exertion. Negative for chest pain, leg swelling, palpitations and syncope.  ? ?   ? ? ?  Vitals:  ? 10/15/21 0956  ?BP: 128/68  ?Pulse: 83  ?Resp: 16  ?Temp: 98 ?F (36.7 ?C)  ?SpO2: 97%  ? ? ? ?Body mass index is 25.99 kg/m?. Danley Danker Weights  ? 10/15/21 0956  ?Weight: 161 lb (73 kg)  ? ? ? ?Objective:  ? Physical Exam ?Vitals and nursing note reviewed.  ?Constitutional:   ?   General: She is not in acute distress. ?Neck:  ?   Vascular: No JVD.  ?Cardiovascular:  ?   Rate and Rhythm: Normal rate and regular rhythm.  ?   Heart sounds: Normal heart sounds. No murmur heard. ?Pulmonary:  ?   Effort: Pulmonary effort is normal.  ?   Breath sounds: Normal breath sounds. No wheezing or rales.  ?Musculoskeletal:  ?   Right lower leg: No edema.  ?   Left lower leg: No edema.  ? ? ? ? ? ?   ? ?Visit diagnoses: ?   ICD-10-CM   ?1. Exertional dyspnea  R06.09 EKG 12-Lead  ?  PCV ECHOCARDIOGRAM COMPLETE  ?  CT Angio Chest Pulmonary Embolism (PE) W or WO Contrast  ?  CANCELED: CT Angio Chest Pulmonary Embolism (PE) W or WO Contrast  ?  ?  ? ?Orders Placed This Encounter  ?Procedures  ? CT Angio Chest Pulmonary Embolism (PE) W or WO Contrast  ? EKG 12-Lead  ? PCV ECHOCARDIOGRAM COMPLETE  ?  ? ?  ? ?Assessment & Recommendations:  ? ?81 y.o. Caucasian female with hypertension, hyperlipidemia, h/o superficial thrombophlebitis, now with subacute onset of exertional dyspnea. ? ?Exertional dyspnea: ?Subacute onset.  Clear lung exam.  Low suspicion for acute cardiac etiology.  Chest x-ray does not explain acute onset of exertional dyspnea.  No wheezing on exam to suspect acute symptoms of reactive airway disease.  Given her fairly rapid onset of symptoms, I am concerned about pulmonary embolism as a possible cause.  Even if D-dimer were to be negative, my clinical suspicion would remain high.  Wells score 3. Therefore, I recommended stat CT angiogram of chest to exclude PE. ? ?If CT is negative for PE, I will obtain echocardiogram to evaluate for any other cardiac etiology. ? ?Further recommendations after above testing. ? ?Thank you for referring the patient to [place]. Please feel free to contact with any questions. ? ? ?Nigel Mormon, MD ?Pager: [phone] ?Office: [phone] ?

## 2021-10-15 ENCOUNTER — Ambulatory Visit: Payer: Medicare PPO | Admitting: Cardiology

## 2021-10-15 ENCOUNTER — Encounter: Payer: Self-pay | Admitting: Cardiology

## 2021-10-15 ENCOUNTER — Ambulatory Visit
Admission: RE | Admit: 2021-10-15 | Discharge: 2021-10-15 | Disposition: A | Discharge status: Home / Self Care | Payer: Medicare PPO | Source: Ambulatory Visit | Attending: Cardiology | Admitting: Cardiology

## 2021-10-15 VITALS — BP 128/68 | HR 83 | Temp 98.0°F | Resp 16 | Ht 66.0 in | Wt 161.0 lb

## 2021-10-15 DIAGNOSIS — R002 Palpitations: Secondary | ICD-10-CM | POA: Diagnosis not present

## 2021-10-15 DIAGNOSIS — R0609 Other forms of dyspnea: Secondary | ICD-10-CM | POA: Diagnosis not present

## 2021-10-15 DIAGNOSIS — J9811 Atelectasis: Secondary | ICD-10-CM | POA: Diagnosis not present

## 2021-10-15 DIAGNOSIS — J479 Bronchiectasis, uncomplicated: Secondary | ICD-10-CM | POA: Diagnosis not present

## 2021-10-15 DIAGNOSIS — I2510 Atherosclerotic heart disease of native coronary artery without angina pectoris: Secondary | ICD-10-CM | POA: Diagnosis not present

## 2021-10-15 MED ORDER — IOPAMIDOL (ISOVUE-370) INJECTION 76%
75.0000 mL | Freq: Once | INTRAVENOUS | Status: AC | PRN
  Administered 2021-10-15: 75 mL via INTRAVENOUS

## 2021-10-26 NOTE — Telephone Encounter (Signed)
From patient.

## 2021-12-16 DIAGNOSIS — F331 Major depressive disorder, recurrent, moderate: Secondary | ICD-10-CM | POA: Diagnosis not present

## 2022-03-12 DIAGNOSIS — R350 Frequency of micturition: Secondary | ICD-10-CM | POA: Diagnosis not present

## 2022-03-12 DIAGNOSIS — J45909 Unspecified asthma, uncomplicated: Secondary | ICD-10-CM | POA: Diagnosis not present

## 2022-03-12 DIAGNOSIS — R4189 Other symptoms and signs involving cognitive functions and awareness: Secondary | ICD-10-CM | POA: Diagnosis not present

## 2022-03-12 DIAGNOSIS — E871 Hypo-osmolality and hyponatremia: Secondary | ICD-10-CM | POA: Diagnosis not present

## 2022-03-12 DIAGNOSIS — Z20822 Contact with and (suspected) exposure to covid-19: Secondary | ICD-10-CM | POA: Diagnosis not present

## 2022-03-12 DIAGNOSIS — R5383 Other fatigue: Secondary | ICD-10-CM | POA: Diagnosis not present

## 2022-03-12 DIAGNOSIS — I10 Essential (primary) hypertension: Secondary | ICD-10-CM | POA: Diagnosis not present

## 2022-03-12 DIAGNOSIS — R0602 Shortness of breath: Secondary | ICD-10-CM | POA: Diagnosis not present

## 2022-03-19 DIAGNOSIS — E785 Hyperlipidemia, unspecified: Secondary | ICD-10-CM | POA: Diagnosis not present

## 2022-03-19 DIAGNOSIS — I10 Essential (primary) hypertension: Secondary | ICD-10-CM | POA: Diagnosis not present

## 2022-03-24 DIAGNOSIS — F331 Major depressive disorder, recurrent, moderate: Secondary | ICD-10-CM | POA: Diagnosis not present

## 2022-04-19 DIAGNOSIS — I10 Essential (primary) hypertension: Secondary | ICD-10-CM | POA: Diagnosis not present

## 2022-04-19 DIAGNOSIS — R7989 Other specified abnormal findings of blood chemistry: Secondary | ICD-10-CM | POA: Diagnosis not present

## 2022-04-19 DIAGNOSIS — E785 Hyperlipidemia, unspecified: Secondary | ICD-10-CM | POA: Diagnosis not present

## 2022-04-26 DIAGNOSIS — Z23 Encounter for immunization: Secondary | ICD-10-CM | POA: Diagnosis not present

## 2022-04-26 DIAGNOSIS — Z1339 Encounter for screening examination for other mental health and behavioral disorders: Secondary | ICD-10-CM | POA: Diagnosis not present

## 2022-04-26 DIAGNOSIS — Z1331 Encounter for screening for depression: Secondary | ICD-10-CM | POA: Diagnosis not present

## 2022-04-26 DIAGNOSIS — Z0000 Encounter for general adult medical examination without abnormal findings: Secondary | ICD-10-CM | POA: Diagnosis not present

## 2022-04-26 DIAGNOSIS — M8580 Other specified disorders of bone density and structure, unspecified site: Secondary | ICD-10-CM | POA: Diagnosis not present

## 2022-04-26 DIAGNOSIS — I10 Essential (primary) hypertension: Secondary | ICD-10-CM | POA: Diagnosis not present

## 2022-04-26 DIAGNOSIS — J45909 Unspecified asthma, uncomplicated: Secondary | ICD-10-CM | POA: Diagnosis not present

## 2022-04-26 DIAGNOSIS — E785 Hyperlipidemia, unspecified: Secondary | ICD-10-CM | POA: Diagnosis not present

## 2022-04-26 DIAGNOSIS — D692 Other nonthrombocytopenic purpura: Secondary | ICD-10-CM | POA: Diagnosis not present

## 2022-04-26 DIAGNOSIS — J42 Unspecified chronic bronchitis: Secondary | ICD-10-CM | POA: Diagnosis not present

## 2022-04-26 DIAGNOSIS — R82998 Other abnormal findings in urine: Secondary | ICD-10-CM | POA: Diagnosis not present

## 2022-04-26 DIAGNOSIS — I700 Atherosclerosis of aorta: Secondary | ICD-10-CM | POA: Diagnosis not present

## 2022-04-26 DIAGNOSIS — J479 Bronchiectasis, uncomplicated: Secondary | ICD-10-CM | POA: Diagnosis not present

## 2022-05-11 DIAGNOSIS — M8589 Other specified disorders of bone density and structure, multiple sites: Secondary | ICD-10-CM | POA: Diagnosis not present

## 2022-05-12 IMAGING — MG MM DIGITAL SCREENING BILAT W/ TOMO AND CAD
8 series · 9 of 24 positions shown · non-contrast
Comparison: Previous exam(s).

CLINICAL DATA: Screening.

EXAM:
DIGITAL SCREENING BILATERAL MAMMOGRAM WITH TOMOSYNTHESIS AND CAD
TECHNIQUE: Bilateral screening digital craniocaudal and mediolateral oblique
mammograms were obtained. Bilateral screening digital breast
tomosynthesis was performed. The images were evaluated with
computer-aided detection.

[R CC synth-2D]
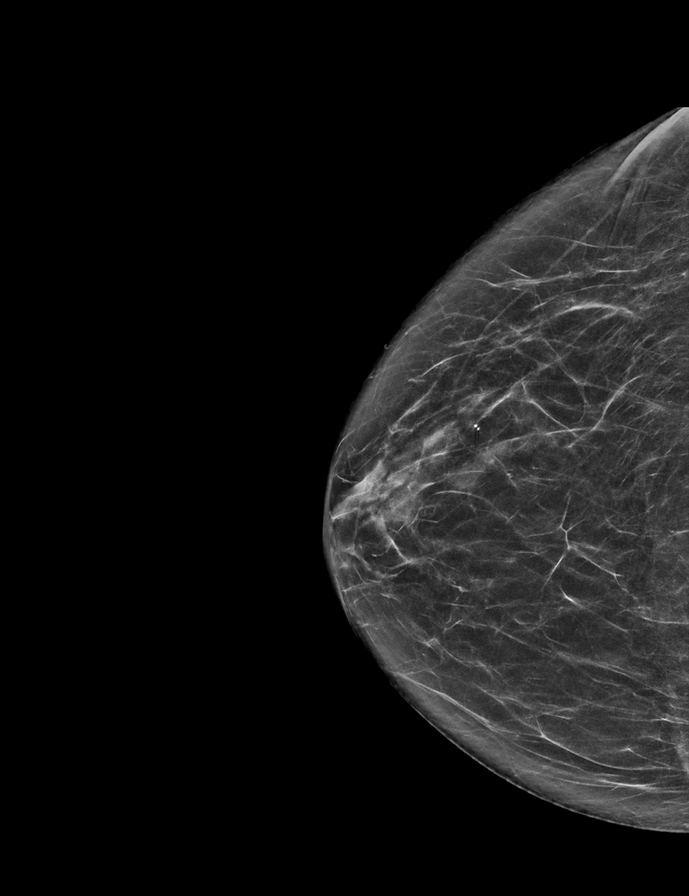

[R MLO synth-2D]
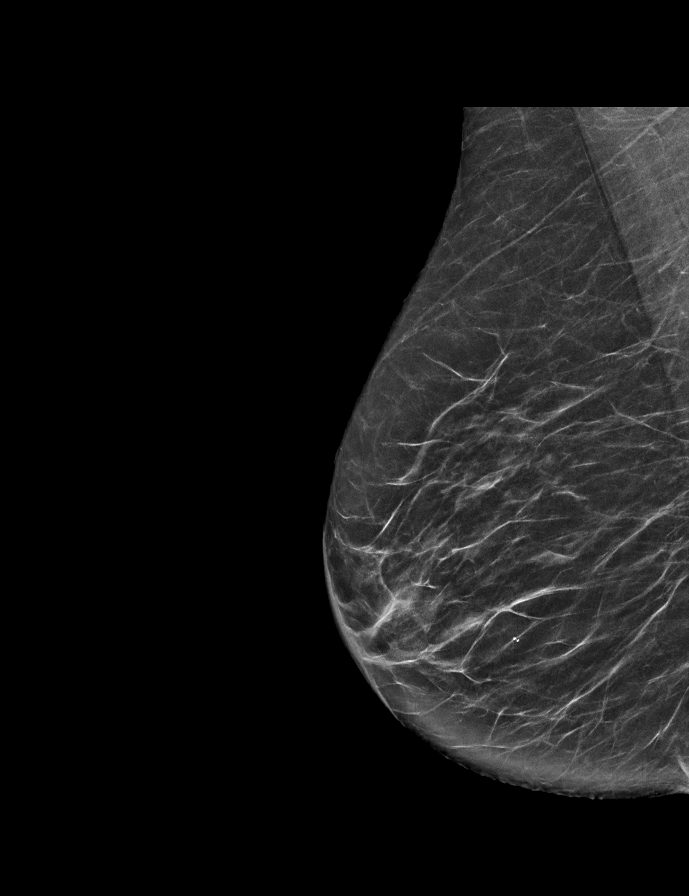

[L CC synth-2D]
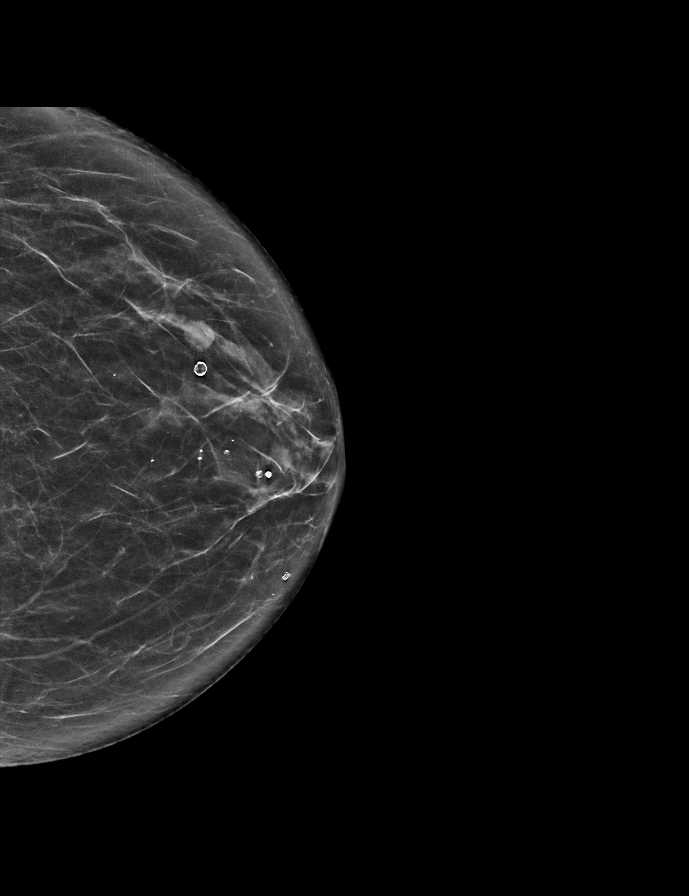

[L MLO synth-2D]
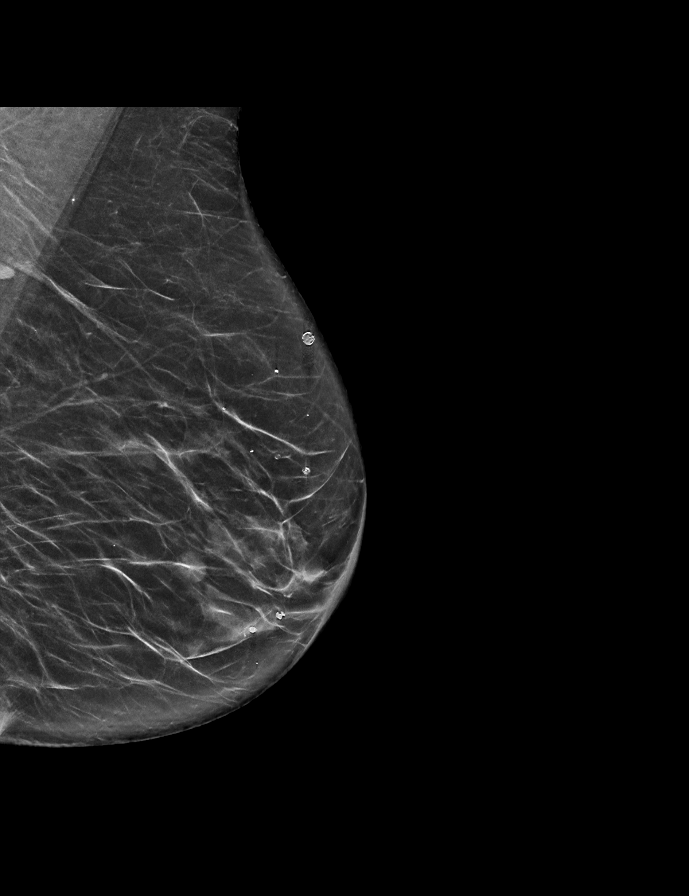

[L MLO tomo · 2 of 63 frames shown]
[frame 21/63]
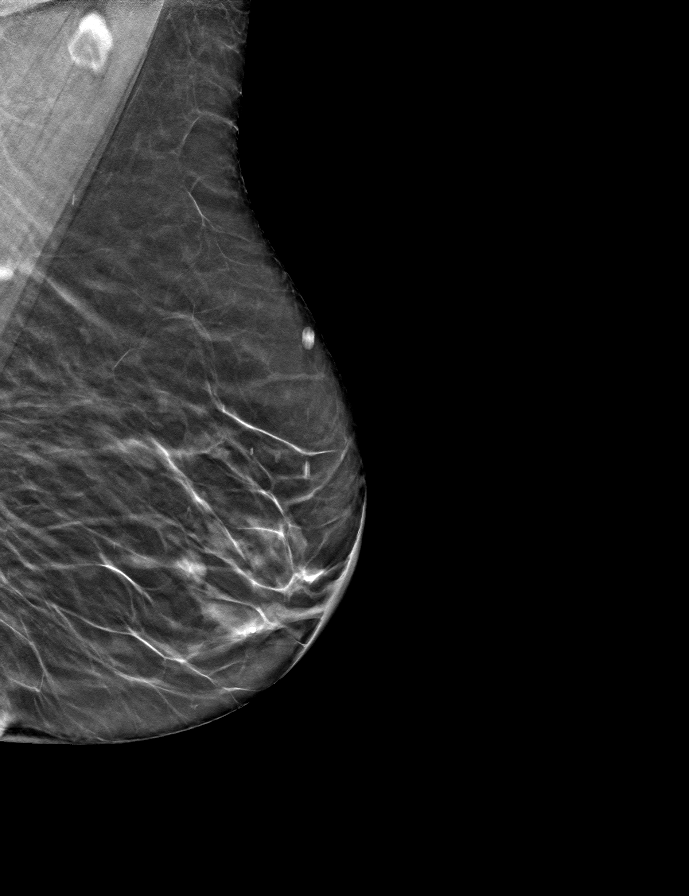
[frame 32/63]
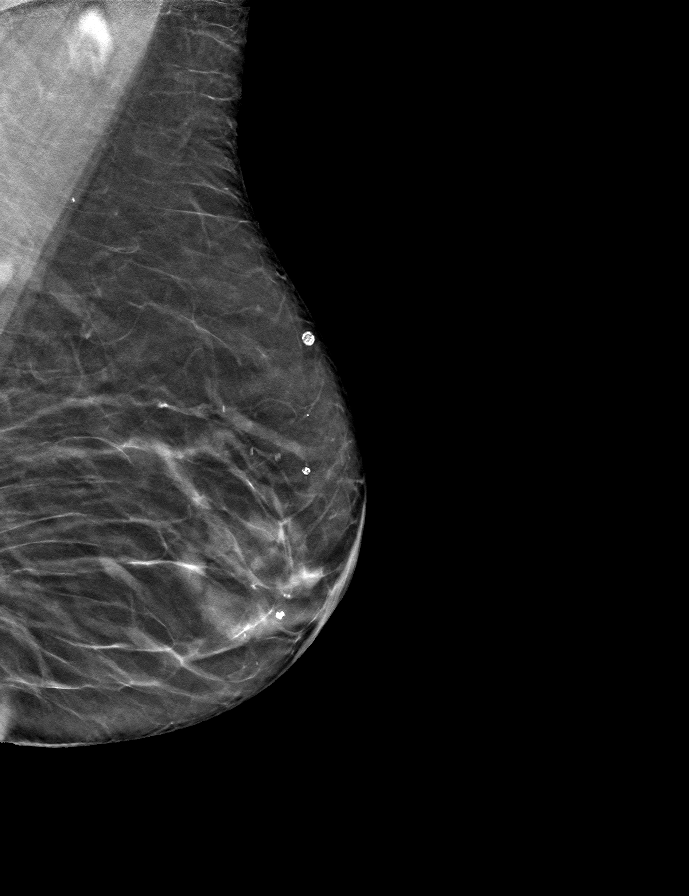

[R MLO tomo · tomo slice 29/57.0]
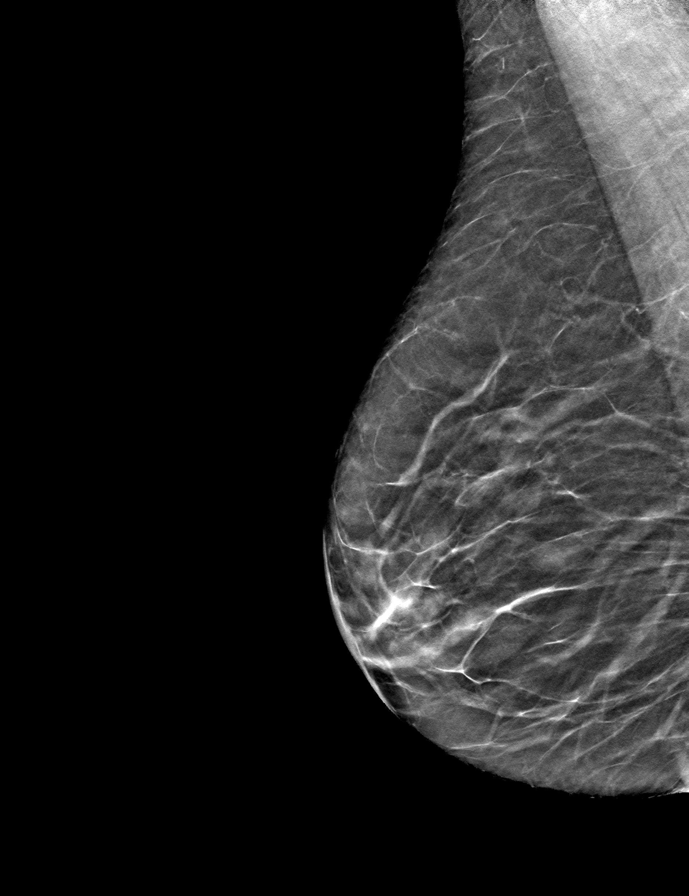

[R CC tomo · tomo slice 29/58.0]
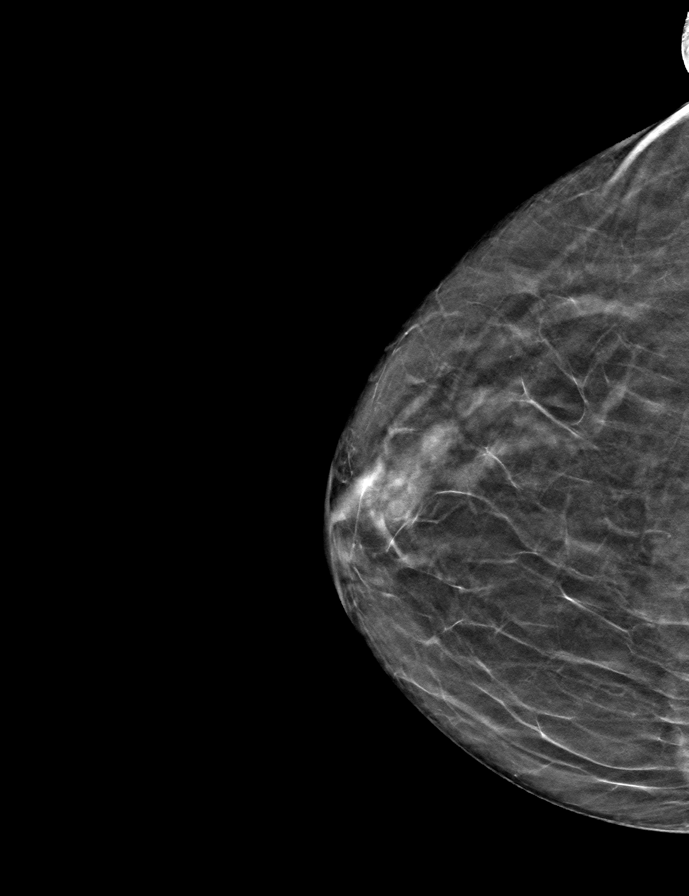

[L CC tomo · tomo slice 30/59.0]
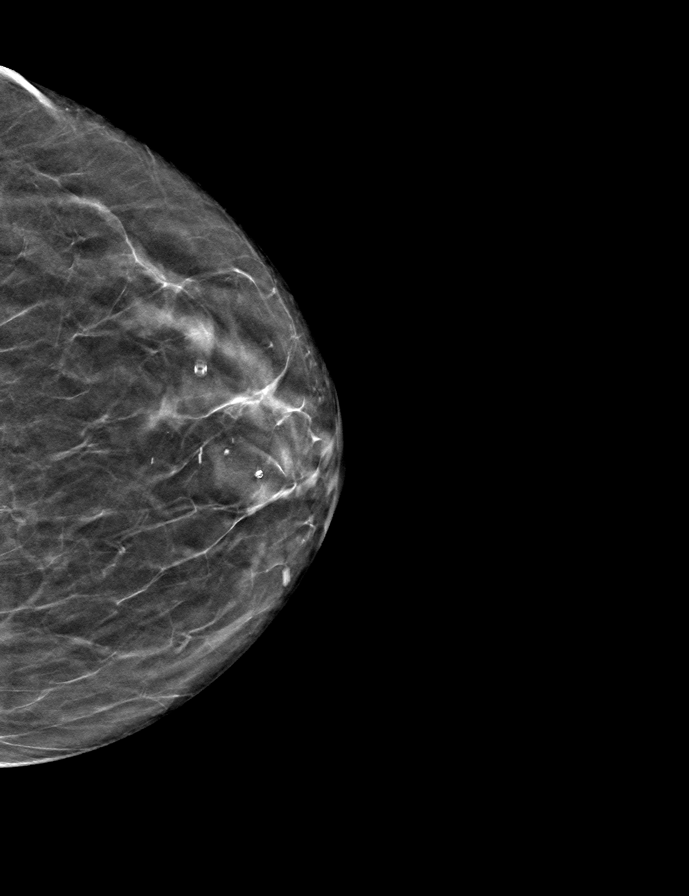

[9 of 24 positions shown; findings below may reference images not displayed]

ACR Breast Density Category b: There are scattered areas of
fibroglandular density.
FINDINGS: There are no findings suspicious for malignancy.
IMPRESSION: No mammographic evidence of malignancy. A result letter of this
screening mammogram will be mailed directly to the patient.

RECOMMENDATION:
Screening mammogram in one year. (Code:51-O-LD2)

BI-RADS CATEGORY  1: Negative.

## 2022-05-21 ENCOUNTER — Other Ambulatory Visit: Payer: Self-pay | Admitting: Internal Medicine

## 2022-05-21 DIAGNOSIS — Z1231 Encounter for screening mammogram for malignant neoplasm of breast: Secondary | ICD-10-CM

## 2022-05-24 ENCOUNTER — Ambulatory Visit
Admission: RE | Admit: 2022-05-24 | Discharge: 2022-05-24 | Disposition: A | Discharge status: Home / Self Care | Payer: Medicare PPO | Source: Ambulatory Visit | Attending: Internal Medicine | Admitting: Internal Medicine

## 2022-05-24 DIAGNOSIS — Z1231 Encounter for screening mammogram for malignant neoplasm of breast: Secondary | ICD-10-CM | POA: Diagnosis not present

## 2022-05-25 DIAGNOSIS — F331 Major depressive disorder, recurrent, moderate: Secondary | ICD-10-CM | POA: Diagnosis not present

## 2022-06-09 ENCOUNTER — Ambulatory Visit: Payer: Medicare PPO | Admitting: Podiatry

## 2022-06-09 VITALS — BP 128/70

## 2022-06-09 DIAGNOSIS — Q828 Other specified congenital malformations of skin: Secondary | ICD-10-CM | POA: Diagnosis not present

## 2022-06-09 NOTE — Progress Notes (Signed)
  Subjective:  Patient ID: Haley Fuller, female    DOB: 06-27-1941,  MRN: [id number]  Chief Complaint  Patient presents with   Callouses    81 y.o. female presents with the above complaint.  Patient presents with complaint bilateral submetatarsal 5 porokeratosis.  This still continue be painful.  She is here to have them debrided and she denies any other acute complaints.   Review of Systems: Negative except as noted in the HPI. Denies N/V/F/Ch.  Past Medical History:  Diagnosis Date   Asthma    Bilateral ankle fractures 2005   Depression    Hyperlipidemia    Hypertension    IFG (impaired fasting glucose)    Lichen planus    OA (osteoarthritis)    hands   Thrombophlebitis leg superficial    right leg   Trigeminal neuralgia    Varicose veins     Current Outpatient Medications:    albuterol (VENTOLIN HFA) 108 (90 Base) MCG/ACT inhaler, INHALE 2 PUFFS EVERY 6 HOURS AS NEEDED FOR WHEEZING OR SHORTNESS OF BREATH., Disp: , Rfl:    atorvastatin (LIPITOR) 20 MG tablet, Take by mouth., Disp: , Rfl:    budesonide-formoterol (SYMBICORT) 160-4.5 MCG/ACT inhaler, Inhale 2 puffs into the lungs 2 (two) times daily as needed. , Disp: , Rfl:    buPROPion (WELLBUTRIN XL) 150 MG 24 hr tablet, Take 150 mg by mouth daily as needed. , Disp: , Rfl:    glucosamine-chondroitin 500-400 MG tablet, Take 1 tablet by mouth daily., Disp: , Rfl:    hydrochlorothiazide (HYDRODIURIL) 12.5 MG tablet, Take 12.5 mg by mouth daily., Disp: , Rfl: 11   losartan (COZAAR) 50 MG tablet, Take 50 mg by mouth 2 (two) times daily., Disp: , Rfl:    venlafaxine XR (EFFEXOR-XR) 75 MG 24 hr capsule, Take 75 mg by mouth 2 (two) times daily., Disp: , Rfl:   Social History   Tobacco Use  Smoking Status Never  Smokeless Tobacco Never    No Known Allergies Objective:   Vitals:   06/09/22 0812  BP: 128/70   There is no height or weight on file to calculate BMI. Constitutional Well developed. Well nourished.   Vascular Dorsalis pedis pulses palpable bilaterally. Posterior tibial pulses palpable bilaterally. Capillary refill normal to all digits.  No cyanosis or clubbing noted. Pedal hair growth normal.  Neurologic Normal speech. Oriented to person, place, and time. Epicritic sensation to light touch grossly present bilaterally.  Dermatologic  hyperkeratotic lesion with central nucleated core noted to bilateral submetatarsal 5.  Pain on palpation to the lesion.  No pinpoint bleeding noted.  Orthopedic: Normal joint ROM without pain or crepitus bilaterally. No visible deformities. No bony tenderness.   Radiographs: None Assessment:   1. Porokeratosis     Plan:  Patient was evaluated and treated and all questions answered.  Bilateral submetatarsal 5 porokeratosis x2 -I explained the patient the etiology of porokeratosis and various treatment options were discussed.  Given amount of pain she is having I believe patient would benefit from debridement of the lesion -Using chisel blade to handle the lesion was debrided down to healthy striated tissue no complication noted no pinpoint bleeding noted. -Offloading pads were dispensed  No follow-ups on file.

## 2022-06-23 ENCOUNTER — Ambulatory Visit: Payer: Medicare PPO | Admitting: Podiatry

## 2022-07-14 DIAGNOSIS — G43909 Migraine, unspecified, not intractable, without status migrainosus: Secondary | ICD-10-CM | POA: Diagnosis not present

## 2022-07-14 DIAGNOSIS — R051 Acute cough: Secondary | ICD-10-CM | POA: Diagnosis not present

## 2022-07-14 DIAGNOSIS — R5383 Other fatigue: Secondary | ICD-10-CM | POA: Diagnosis not present

## 2022-07-14 DIAGNOSIS — Z1152 Encounter for screening for COVID-19: Secondary | ICD-10-CM | POA: Diagnosis not present

## 2022-07-14 DIAGNOSIS — R0981 Nasal congestion: Secondary | ICD-10-CM | POA: Diagnosis not present

## 2022-07-14 DIAGNOSIS — R350 Frequency of micturition: Secondary | ICD-10-CM | POA: Diagnosis not present

## 2022-07-14 DIAGNOSIS — J101 Influenza due to other identified influenza virus with other respiratory manifestations: Secondary | ICD-10-CM | POA: Diagnosis not present

## 2022-07-24 DIAGNOSIS — N3001 Acute cystitis with hematuria: Secondary | ICD-10-CM | POA: Diagnosis not present

## 2022-07-24 DIAGNOSIS — N3090 Cystitis, unspecified without hematuria: Secondary | ICD-10-CM | POA: Diagnosis not present

## 2022-09-23 DIAGNOSIS — F331 Major depressive disorder, recurrent, moderate: Secondary | ICD-10-CM | POA: Diagnosis not present

## 2022-10-15 ENCOUNTER — Ambulatory Visit: Payer: Medicare PPO

## 2022-10-15 DIAGNOSIS — H43393 Other vitreous opacities, bilateral: Secondary | ICD-10-CM | POA: Diagnosis not present

## 2022-10-15 DIAGNOSIS — R0609 Other forms of dyspnea: Secondary | ICD-10-CM | POA: Diagnosis not present

## 2022-11-02 DIAGNOSIS — J45909 Unspecified asthma, uncomplicated: Secondary | ICD-10-CM | POA: Diagnosis not present

## 2022-11-02 DIAGNOSIS — R058 Other specified cough: Secondary | ICD-10-CM | POA: Diagnosis not present

## 2022-11-02 DIAGNOSIS — J309 Allergic rhinitis, unspecified: Secondary | ICD-10-CM | POA: Diagnosis not present

## 2022-11-02 DIAGNOSIS — J189 Pneumonia, unspecified organism: Secondary | ICD-10-CM | POA: Diagnosis not present

## 2022-11-04 IMAGING — CT CT ANGIO CHEST
2 of 8 series · 10 of 36 positions shown · IV contrast (agent unspecified)
Comparison: None

CLINICAL DATA: Shortness of breath, dyspnea with exertion and heart
palpitations for 1 week, unknown D-dimer, question pulmonary
embolism

EXAM:
CT ANGIOGRAPHY CHEST WITH CONTRAST
TECHNIQUE: Multidetector CT imaging of the chest was performed using the
standard protocol during bolus administration of intravenous
contrast. Multiplanar CT image reconstructions and MIPs were
obtained to evaluate the vascular anatomy.

[Series 7: cta pulmonary 2.00 bv36 s3 · coronal · 0.59mm/px · 1 of 132 slices shown]
[im 66/132  mediastinal]
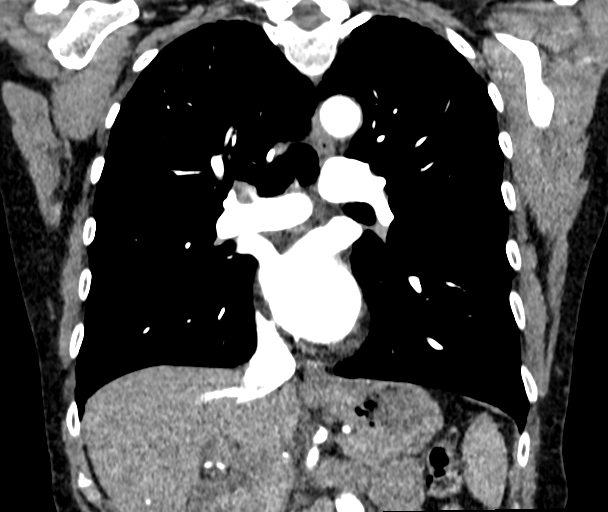

[Series 12: cta pulmonary 1.00 bv36 s3 super d. · axial · 0.66mm/px · z∈[+1486,+1736]mm · 9 of 390 slices shown]
[im 39/390  lung]
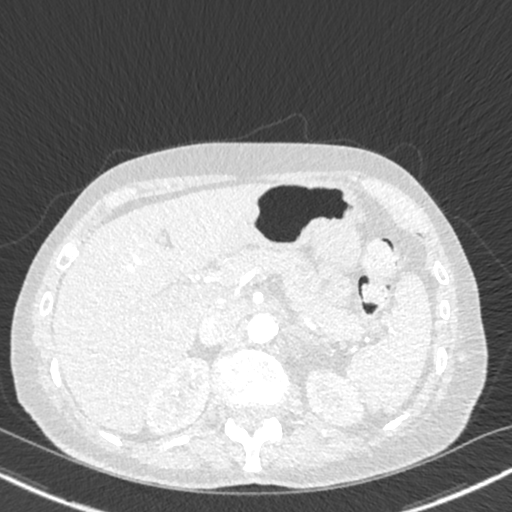
[im 78/390  mediastinal]
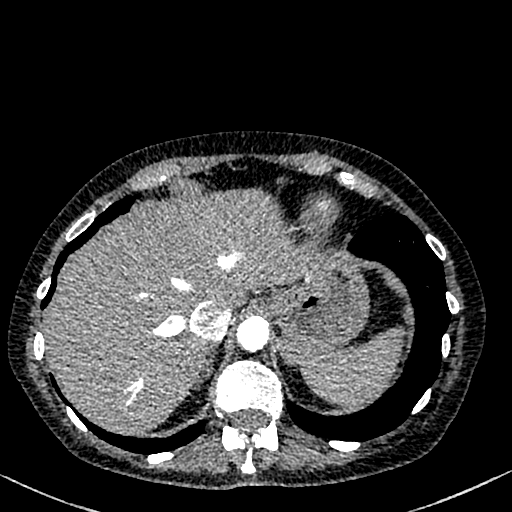
[im 117/390  lung]
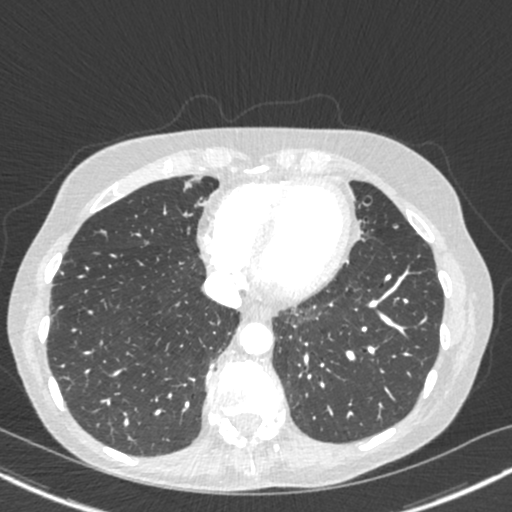
[im 156/390  mediastinal]
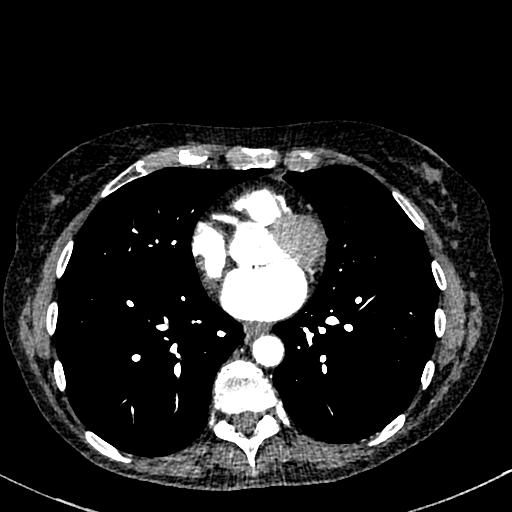
[im 195/390  lung]
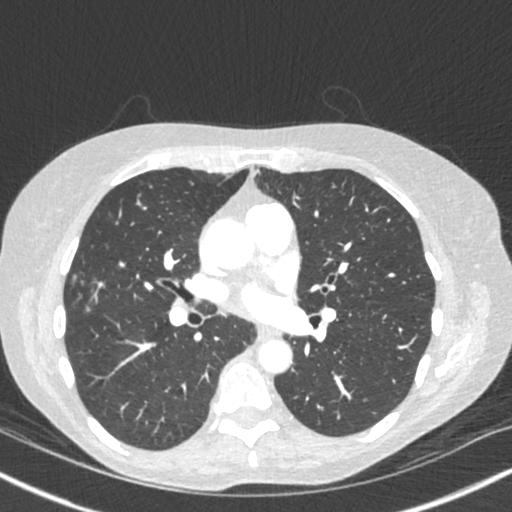
[im 234/390  mediastinal]
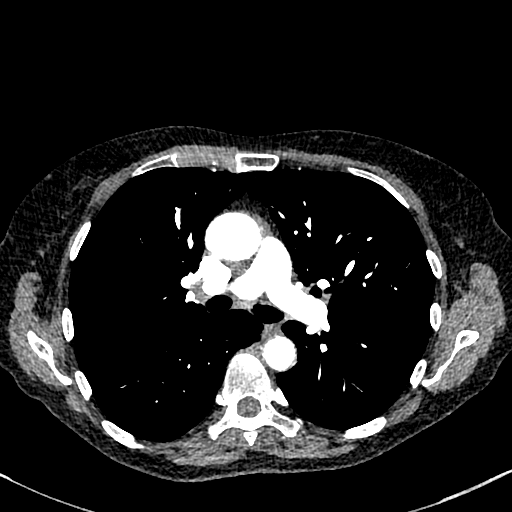
[im 273/390  lung]
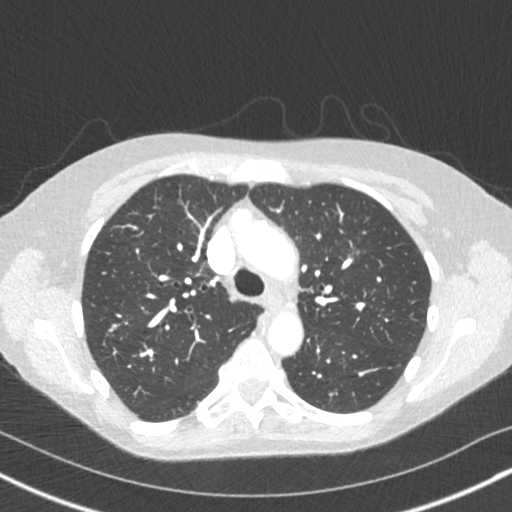
[im 312/390  mediastinal]
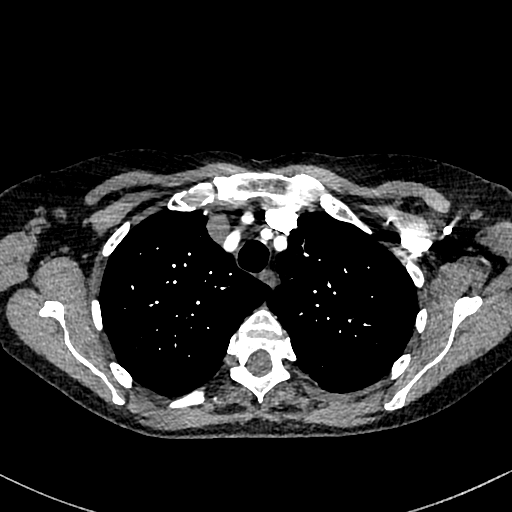
[im 351/390  lung]
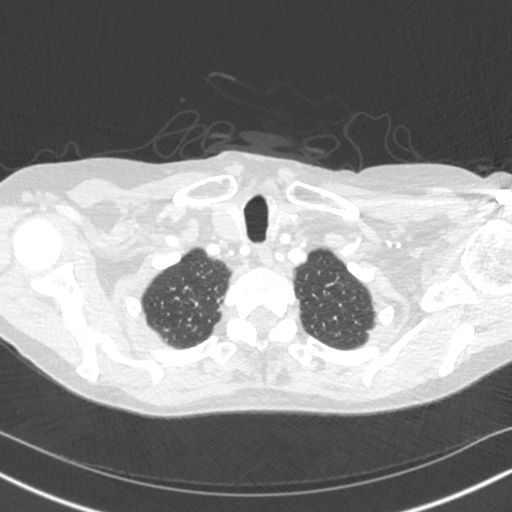

[10 of 36 positions shown; findings below may reference images not displayed]

RADIATION DOSE REDUCTION: This exam was performed according to the
departmental dose-optimization program which includes automated
exposure control, adjustment of the mA and/or kV according to
patient size and/or use of iterative reconstruction technique.

CONTRAST:  75mL 6BF4SI-K22 IOPAMIDOL (6BF4SI-K22) INJECTION 76% IV
FINDINGS: Cardiovascular: Atherosclerotic calcifications aorta coronary
arteries and proximal great vessels. Aorta normal caliber without
aneurysm or dissection. Heart unremarkable. No pericardial effusion.
Pulmonary arteries adequately opacified and patent. No evidence of
pulmonary embolism.

Mediastinum/Nodes: Base of cervical region normal appearance. Few
normal size mediastinal lymph nodes without thoracic adenopathy.
Esophagus unremarkable.

Lungs/Pleura: Minimal patchy infiltrate RIGHT upper lobe.
Subsegmental atelectasis at base of RIGHT middle lobe. 3 mm RIGHT
lobe middle lobe nodule image 112. 5 mm subsolid nodule image 61,
RIGHT upper lobe. Bronchiectasis in inferior segment lingula. No
pleural effusion or pneumothorax.

Upper Abdomen: Visualized upper abdomen unremarkable

Musculoskeletal: No acute osseous findings.

Review of the MIP images confirms the above findings.
IMPRESSION: No evidence of pulmonary embolism.

Minimal patchy infiltrate RIGHT upper lobe.

Bronchiectasis in inferior segment lingula.

Coronary artery calcifications.

3 mm RIGHT middle lobe nodule and 5 mm subsolid nodule RIGHT upper
lobe; no routine follow-up imaging is recommended per [employer]
Society Guidelines.

These guidelines do not apply to immunocompromised patients and
patients with cancer. Follow up in patients with significant
comorbidities as clinically warranted. For lung cancer screening,
adhere to Lung-RADS guidelines. Reference: Radiology. 1416;

Aortic Atherosclerosis (06Y7D-25Q.Q).

## 2022-11-16 DIAGNOSIS — H33313 Horseshoe tear of retina without detachment, bilateral: Secondary | ICD-10-CM | POA: Diagnosis not present

## 2022-11-17 DIAGNOSIS — G3184 Mild cognitive impairment, so stated: Secondary | ICD-10-CM | POA: Diagnosis not present

## 2022-11-17 DIAGNOSIS — G500 Trigeminal neuralgia: Secondary | ICD-10-CM | POA: Diagnosis not present

## 2022-11-17 DIAGNOSIS — J189 Pneumonia, unspecified organism: Secondary | ICD-10-CM | POA: Diagnosis not present

## 2022-11-17 DIAGNOSIS — J45909 Unspecified asthma, uncomplicated: Secondary | ICD-10-CM | POA: Diagnosis not present

## 2022-11-17 DIAGNOSIS — R413 Other amnesia: Secondary | ICD-10-CM | POA: Diagnosis not present

## 2022-11-17 DIAGNOSIS — F3341 Major depressive disorder, recurrent, in partial remission: Secondary | ICD-10-CM | POA: Diagnosis not present

## 2022-11-30 DIAGNOSIS — J189 Pneumonia, unspecified organism: Secondary | ICD-10-CM | POA: Diagnosis not present

## 2022-11-30 DIAGNOSIS — G3184 Mild cognitive impairment, so stated: Secondary | ICD-10-CM | POA: Diagnosis not present

## 2022-11-30 DIAGNOSIS — F3341 Major depressive disorder, recurrent, in partial remission: Secondary | ICD-10-CM | POA: Diagnosis not present

## 2022-12-23 DIAGNOSIS — F331 Major depressive disorder, recurrent, moderate: Secondary | ICD-10-CM | POA: Diagnosis not present

## 2023-01-10 DIAGNOSIS — R519 Headache, unspecified: Secondary | ICD-10-CM | POA: Diagnosis not present

## 2023-01-10 DIAGNOSIS — G500 Trigeminal neuralgia: Secondary | ICD-10-CM | POA: Diagnosis not present

## 2023-01-10 DIAGNOSIS — R6884 Jaw pain: Secondary | ICD-10-CM | POA: Diagnosis not present

## 2023-01-21 ENCOUNTER — Ambulatory Visit: Payer: Medicare PPO | Admitting: Neurology

## 2023-01-21 ENCOUNTER — Encounter: Payer: Self-pay | Admitting: Neurology

## 2023-01-21 VITALS — BP 150/88 | HR 122 | Ht 66.0 in | Wt 167.5 lb

## 2023-01-21 DIAGNOSIS — G500 Trigeminal neuralgia: Secondary | ICD-10-CM

## 2023-01-21 MED ORDER — OXCARBAZEPINE 150 MG PO TABS: 300.0000 mg | ORAL_TABLET | Freq: Two times a day (BID) | ORAL | 6 refills | Status: DC

## 2023-01-21 NOTE — Progress Notes (Signed)
Chief Complaint  Patient presents with   New Patient (Initial Visit)    Rm 14. Patient with daughter, reports being diagnosed in 2019 by Dr. Terrace Arabia. Reports flare up started on around July 1 that was worst than any other flare up, nothing is helping, talking has become difficult throughout the day and eating and having more than 30 attacks in an hour. Gabapentin is not helping and makes her feel drowsy.       ASSESSMENT AND PLAN  Haley Fuller is a 82 y.o. 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   MEDICAL HISTORY:  Haley Fuller, is a 82 year female, seen in request by her PCP Dr. Martha Clan for right trigeminal neuralgia.  I reviewed and summarized the referring note. PMHX. Depression HTN HLD  She had a history of right trigeminal neuralgia, initial episode was in November 2018, shooting pain to right lower molar, to the point of had the dental work at the right lower teeth without resolving her symptoms, occasionally also have radiating pain to her right cheek, right lateral nasal ala, initial episode last about few months, gradually improved, I saw her in April 2019, but at that time, her symptoms has much improved, decided not to proceed with any medication and evaluation at that time  She has been doing very well until 2023, she began to have intermittent right facial pain radiating to right lower cheek, usually improved by putting pressure at her right jaw area,  Her symptoms become much worse since January 03, 2023, slow worsening shooting pain, would not go away, difficulty talking, eating, brushing her tooth,  Tried gabapentin up to 600 mg twice daily, did  help her pain, but complains of significant dizziness, lightheadedness, previously also have similar side effect even with low-dose of Lyrica,  Since 2022, she developed mild left hand tremor, also complains of decreased sensation of smell, worsening constipations, also had vivid dreams, sister has dementia   PHYSICAL EXAM:   Vitals:   01/21/23 0822  BP: (!) 150/88  Pulse: (!) 122  Weight: 167 lb 8 oz (76 kg)  Height: 5\' 6"  (1.676 m)   Not recorded     Body mass index is 27.04 kg/m.  PHYSICAL EXAMNIATION:  Gen: NAD, conversant, well nourised, well groomed                     Cardiovascular: Regular rate rhythm, no peripheral edema, warm, nontender. Eyes: Conjunctivae clear without exudates or hemorrhage Neck: Supple, no carotid bruits. Pulmonary: Clear to auscultation bilaterally   NEUROLOGICAL EXAM:  MENTAL STATUS: Speech/cognition: Awake, alert, oriented to history taking and casual conversation CRANIAL NERVES: CN II: Visual fields are full to confrontation. Pupils are round equal and briskly reactive to light. CN III, IV, VI: extraocular movement are normal. No ptosis. CN V: Facial sensation is intact to light touch, bilateral corneal reflexes were symmetric. CN VII: Face is symmetric with normal eye closure  CN VIII: Hearing is normal to causal conversation. CN IX, X: Phonation is normal. CN XI: Head turning and shoulder shrug are intact  MOTOR: Left hand resting tremor, normal strength, mild left more than  right bradykinesia  REFLEXES: Reflexes are 2+ and symmetric at the biceps, triceps, knees, and ankles. Plantar responses are flexor.  SENSORY: Intact to light touch, pinprick and vibratory sensation are intact in fingers and toes.  COORDINATION: There is no trunk or limb dysmetria noted.  GAIT/STANCE: Pushup, lean forward, steady  REVIEW OF SYSTEMS:  Full 14 system review of systems performed and notable only for as above All other review of systems  were negative.   ALLERGIES: No Known Allergies  HOME MEDICATIONS: Current Outpatient Medications  Medication Sig Dispense Refill   atorvastatin (LIPITOR) 20 MG tablet Take by mouth.     budesonide-formoterol (SYMBICORT) 160-4.5 MCG/ACT inhaler Inhale 2 puffs into the lungs 2 (two) times daily as needed.      buPROPion (WELLBUTRIN XL) 150 MG 24 hr tablet Take 150 mg by mouth daily as needed.      glucosamine-chondroitin 500-400 MG tablet Take 1 tablet by mouth daily.     losartan (COZAAR) 50 MG tablet Take 50 mg by mouth 2 (two) times daily.     venlafaxine XR (EFFEXOR-XR) 75 MG 24 hr capsule Take 75 mg by mouth 2 (two) times daily.     albuterol (VENTOLIN HFA) 108 (90 Base) MCG/ACT inhaler INHALE 2 PUFFS EVERY 6 HOURS AS NEEDED FOR WHEEZING OR SHORTNESS OF BREATH.     hydrochlorothiazide (HYDRODIURIL) 12.5 MG tablet Take 12.5 mg by mouth daily.  11   No current facility-administered medications for this visit.    PAST MEDICAL HISTORY: Past Medical History:  Diagnosis Date   Asthma    Bilateral ankle fractures 2005   Depression    Hyperlipidemia    Hypertension    IFG (impaired fasting glucose)    Lichen planus    OA (osteoarthritis)    hands   Thrombophlebitis leg superficial    right leg   Trigeminal neuralgia    Varicose veins     PAST SURGICAL HISTORY: Past Surgical History:  Procedure Laterality Date   ABDOMINAL HYSTERECTOMY     partial   COLONOSCOPY     Last in 2014    FAMILY HISTORY: Family History  Problem Relation Age of Onset   Stroke Mother    Cancer Father        bladder   Breast cancer Sister    Breast cancer Brother    Breast cancer Brother    Breast cancer Brother    Breast cancer Brother     SOCIAL HISTORY: Social History   Socioeconomic History   Marital status: Widowed    Spouse name: Not on file   Number of children: 3   Years of education: 25   Highest education level: Bachelor's degree (e.g., BA, AB, BS)  Occupational History    Occupation: Retired  Tobacco Use   Smoking status: Never   Smokeless tobacco: Never  Vaping Use   Vaping status: Never Used  Substance and Sexual Activity   Alcohol use: No    Alcohol/week: 0.0 standard drinks of alcohol   Drug use: No   Sexual activity: Not on file  Other Topics Concern   Not on file  Social History Narrative   She is a widow, her husband was a Optician, dispensing.  She has 2 sons born 44 and 80 and a daughter born in 35.  1 of her sons is a Arboriculturist near [place].      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      No alcohol tobacco or drug use.  She has 4 glasses of tea or cups of tea a day.      Right-handed.   Social Determinants of Health   Financial Resource Strain: Low Risk  (09/23/2022)   Received from [facility], [facility]   Overall Financial Resource Strain (CARDIA)    Difficulty of Paying Living Expenses: Not hard at all  Food Insecurity: No Food Insecurity (09/23/2022)   Received from [facility], [facility]   Hunger Vital Sign    Worried About Running Out of Food in the Last Year: Never true    Ran Out of Food in the Last Year: Never true  Transportation Needs: No Transportation Needs (09/23/2022)   Received from [facility], [facility]   PRAPARE - Transportation    Lack of Transportation (Medical): No    Lack of Transportation (Non-Medical): No  Physical Activity: Not on file  Stress: Not on file  Social Connections: Unknown (11/15/2021)   [facility] from [facility], [facility]   Social Network    Social Network: Not on file  Intimate Partner Violence: Unknown (10/07/2021)   Received from [facility], [facility]   HITS    Physically Hurt: Not on file    Insult or Talk Down To: Not on file    Threaten Physical Harm: Not on file    Scream or Curse: Not on file      Levert Feinstein, M.D. Ph.D.  [facility] [address] [place], [place] [address] Ph: [phone] Fax: [phone]  CC:  Willis Modena, NP [address] [place],  [place] [address]  Cleatis Polka., MD

## 2023-01-22 LAB — CBC WITH DIFFERENTIAL/PLATELET
Basophils Absolute: 0 10*3/uL (ref 0.0–0.2)
Basos: 1 %
EOS (ABSOLUTE): 0.1 10*3/uL (ref 0.0–0.4)
Eos: 4 %
Hematocrit: 40 % (ref 34.0–46.6)
Hemoglobin: 13.6 g/dL (ref 11.1–15.9)
Immature Grans (Abs): 0 10*3/uL (ref 0.0–0.1)
Immature Granulocytes: 0 %
Lymphocytes Absolute: 1.1 10*3/uL (ref 0.7–3.1)
Lymphs: 33 %
MCH: 30.8 pg (ref 26.6–33.0)
MCHC: 34 g/dL (ref 31.5–35.7)
MCV: 91 fL (ref 79–97)
Monocytes Absolute: 0.4 10*3/uL (ref 0.1–0.9)
Monocytes: 13 %
Neutrophils Absolute: 1.6 10*3/uL (ref 1.4–7.0)
Neutrophils: 49 %
Platelets: 211 10*3/uL (ref 150–450)
RBC: 4.41 x10E6/uL (ref 3.77–5.28)
RDW: 12.3 % (ref 11.7–15.4)
WBC: 3.2 10*3/uL — ABNORMAL LOW (ref 3.4–10.8)

## 2023-01-22 LAB — TSH: TSH: 3.37 u[IU]/mL (ref 0.450–4.500)

## 2023-01-22 LAB — COMPREHENSIVE METABOLIC PANEL
ALT: 23 IU/L (ref 0–32)
AST: 32 IU/L (ref 0–40)
Albumin: 4.3 g/dL (ref 3.8–4.8)
Alkaline Phosphatase: 104 IU/L (ref 44–121)
BUN/Creatinine Ratio: 25 (ref 12–28)
BUN: 16 mg/dL (ref 8–27)
Bilirubin Total: 0.6 mg/dL (ref 0.0–1.2)
CO2: 23 mmol/L (ref 20–29)
Calcium: 9.9 mg/dL (ref 8.7–10.3)
Chloride: 102 mmol/L (ref 96–106)
Creatinine, Ser: 0.64 mg/dL (ref 0.57–1.00)
Globulin, Total: 2 g/dL (ref 1.5–4.5)
Glucose: 83 mg/dL (ref 70–99)
Potassium: 4.5 mmol/L (ref 3.5–5.2)
Sodium: 138 mmol/L (ref 134–144)
Total Protein: 6.3 g/dL (ref 6.0–8.5)
eGFR: 90 mL/min/{1.73_m2} (ref >59)

## 2023-01-24 ENCOUNTER — Telehealth: Payer: Self-pay | Admitting: Neurology

## 2023-01-24 NOTE — Telephone Encounter (Signed)
Cohere Berkley Harvey: [id number] exp. 01/24/23-03/25/23 sent to [facility] [phone]

## 2023-02-15 DIAGNOSIS — F331 Major depressive disorder, recurrent, moderate: Secondary | ICD-10-CM | POA: Diagnosis not present

## 2023-03-02 ENCOUNTER — Ambulatory Visit
Admission: RE | Admit: 2023-03-02 | Discharge: 2023-03-02 | Disposition: A | Discharge status: Home / Self Care | Payer: Medicare PPO | Source: Ambulatory Visit | Attending: Neurology | Admitting: Neurology

## 2023-03-02 DIAGNOSIS — G500 Trigeminal neuralgia: Secondary | ICD-10-CM | POA: Diagnosis not present

## 2023-03-02 MED ORDER — GADOPICLENOL 0.5 MMOL/ML IV SOLN
8.0000 mL | Freq: Once | INTRAVENOUS | Status: AC | PRN
  Administered 2023-03-02: 8 mL via INTRAVENOUS

## 2023-03-17 DIAGNOSIS — J45909 Unspecified asthma, uncomplicated: Secondary | ICD-10-CM | POA: Diagnosis not present

## 2023-03-17 DIAGNOSIS — I10 Essential (primary) hypertension: Secondary | ICD-10-CM | POA: Diagnosis not present

## 2023-03-17 DIAGNOSIS — J029 Acute pharyngitis, unspecified: Secondary | ICD-10-CM | POA: Diagnosis not present

## 2023-03-17 DIAGNOSIS — R051 Acute cough: Secondary | ICD-10-CM | POA: Diagnosis not present

## 2023-03-17 DIAGNOSIS — R5383 Other fatigue: Secondary | ICD-10-CM | POA: Diagnosis not present

## 2023-03-17 DIAGNOSIS — G3184 Mild cognitive impairment, so stated: Secondary | ICD-10-CM | POA: Diagnosis not present

## 2023-03-17 DIAGNOSIS — R0981 Nasal congestion: Secondary | ICD-10-CM | POA: Diagnosis not present

## 2023-03-17 DIAGNOSIS — Z1152 Encounter for screening for COVID-19: Secondary | ICD-10-CM | POA: Diagnosis not present

## 2023-04-21 ENCOUNTER — Ambulatory Visit: Payer: Medicare PPO | Admitting: Neurology

## 2023-04-21 ENCOUNTER — Encounter: Payer: Self-pay | Admitting: Neurology

## 2023-04-21 VITALS — BP 157/83 | HR 67 | Ht 66.5 in | Wt 166.0 lb

## 2023-04-21 DIAGNOSIS — R269 Unspecified abnormalities of gait and mobility: Secondary | ICD-10-CM

## 2023-04-21 DIAGNOSIS — G500 Trigeminal neuralgia: Secondary | ICD-10-CM

## 2023-04-21 NOTE — Progress Notes (Signed)
Chief Complaint  Patient presents with   Trigeminal Neuralgia    Rm15, daughter present, Right trigeminal neuralgia:painful in the past week, LIGHTENING STRIKE PAIN right jaw      ASSESSMENT AND PLAN  Haley Fuller is a 82 y.o. 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   MEDICAL HISTORY:  Haley Fuller, is a 82 year female, seen in request by her PCP Dr. Martha Clan for right trigeminal neuralgia.  I reviewed and summarized the referring note. PMHX. Depression HTN HLD  She had a history of right trigeminal neuralgia, initial episode was in November 2018, shooting pain to right lower molar, to the point of had the dental work at the right lower teeth without resolving her symptoms, occasionally also have radiating pain to her right cheek, right lateral nasal ala, initial episode last about few months, gradually improved, I saw her in April 2019, but at that time, her symptoms has much improved, decided not to proceed with any medication and evaluation at that time  She has been doing very well until 2023, she began to have intermittent right facial pain radiating to right lower cheek, usually improved by putting pressure at her right jaw area,  Her symptoms become much worse since January 03, 2023, slow worsening shooting pain, would not go  away, difficulty talking, eating, brushing her tooth,  Tried gabapentin up to 600 mg twice daily, did help her pain, but complains of significant dizziness, lightheadedness, previously also have similar side effect even with low-dose of Lyrica,  Since 2022, she developed mild left hand tremor, also complains of decreased sensation of smell, worsening constipations, also had vivid dreams, sister has dementia  UPDATE Apr 21 2023: She is a retired Administrator, arts for eighth grade, noticed mild memory loss, MoCA examination is 25/30 today, mild slow reaction time, well-preserved short-term memory  Following last visit in July 2024 she complains of significant right lower jaw pain, but was not able to follow the recommendation, has not even picked up the Trileptal prescription yet, she only take gabapentin 300 mg as needed, worry about the side effect of slower reaction, when she take 1 or 2 tablets, it does help her symptoms some  Because of her constant reported 10 out of 10 right lower jaw pain, she could not eat sleep or drink very well  Personally reviewed MRI of the brain with without in August 2024 that was normal,  PHYSICAL EXAM:   Vitals:   04/21/23 0900  BP: (!) 157/83  Pulse: 67  Weight: 166 lb (75.3 kg)  Height: 5' 6.5" (1.689 m)    Body mass index is 26.39 kg/m.  PHYSICAL EXAMNIATION:  Gen: NAD, conversant, well nourised, well groomed  Cardiovascular: Regular rate rhythm, no peripheral edema, warm, nontender. Eyes: Conjunctivae clear without exudates or hemorrhage Neck: Supple, no carotid bruits. Pulmonary: Clear to auscultation bilaterally   NEUROLOGICAL EXAM:  MENTAL STATUS: Speech/cognition: Awake, alert, oriented to history taking and casual conversation    04/21/2023    9:00 AM  Montreal Cognitive Assessment   Visuospatial/ Executive (0/5) 2  Naming (0/3) 3  Attention: Read list of digits (0/2) 2  Attention: Read list of letters (0/1) 1   Attention: Serial 7 subtraction starting at 100 (0/3) 2  Language: Repeat phrase (0/2) 2  Language : Fluency (0/1) 0  Abstraction (0/2) 2  Delayed Recall (0/5) 5  Orientation (0/6) 6  Total 25    CRANIAL NERVES: CN II: Visual fields are full to confrontation. Pupils are round equal and briskly reactive to light. CN III, IV, VI: extraocular movement are normal. No ptosis. CN V: Facial sensation is intact to light touch, bilateral corneal reflexes were symmetric. CN VII: Face is symmetric with normal eye closure  CN VIII: Hearing is normal to causal conversation. CN IX, X: Phonation is normal. CN XI: Head turning and shoulder shrug are intact  MOTOR: Left hand resting tremor, normal strength, mild left more than right bradykinesia  REFLEXES: Reflexes are 2+ and symmetric at the biceps, triceps, knees, and ankles. Plantar responses are flexor.  SENSORY: Intact to light touch, pinprick and vibratory sensation are intact in fingers and toes.  COORDINATION: There is no trunk or limb dysmetria noted.  GAIT/STANCE: Pushup, lean forward, steady  REVIEW OF SYSTEMS:  Full 14 system review of systems performed and notable only for as above All other review of systems were negative.   ALLERGIES: Allergies  Allergen Reactions   Atorvastatin     Other Reaction(s): lichen planus    HOME MEDICATIONS: Current Outpatient Medications  Medication Sig Dispense Refill   albuterol (VENTOLIN HFA) 108 (90 Base) MCG/ACT inhaler INHALE 2 PUFFS EVERY 6 HOURS AS NEEDED FOR WHEEZING OR SHORTNESS OF BREATH.     atorvastatin (LIPITOR) 20 MG tablet Take by mouth.     budesonide-formoterol (SYMBICORT) 160-4.5 MCG/ACT inhaler Inhale 2 puffs into the lungs 2 (two) times daily as needed.      buPROPion (WELLBUTRIN XL) 150 MG 24 hr tablet Take 150 mg by mouth daily as needed.      glucosamine-chondroitin 500-400 MG tablet Take 1 tablet by mouth daily.     hydrochlorothiazide (HYDRODIURIL) 12.5 MG tablet  Take 12.5 mg by mouth daily.  11   losartan (COZAAR) 50 MG tablet Take 50 mg by mouth 2 (two) times daily.     OXcarbazepine (TRILEPTAL) 150 MG tablet Take 2 tablets (300 mg total) by mouth 2 (two) times daily. 120 tablet 6   venlafaxine XR (EFFEXOR-XR) 75 MG 24 hr capsule Take 75 mg by mouth 2 (two) times daily.     No current facility-administered medications for this visit.    PAST MEDICAL HISTORY: Past Medical History:  Diagnosis Date   Asthma    Bilateral ankle fractures 2005   Depression    Hyperlipidemia    Hypertension    IFG (impaired fasting glucose)    Lichen planus    OA (osteoarthritis)    hands   Thrombophlebitis leg superficial    right leg   Trigeminal neuralgia    Varicose veins     PAST SURGICAL HISTORY: Past Surgical History:  Procedure Laterality Date   ABDOMINAL HYSTERECTOMY     partial   COLONOSCOPY  Last in 2014    FAMILY HISTORY: Family History  Problem Relation Age of Onset   Stroke Mother    Cancer Father        bladder   Breast cancer Sister    Breast cancer Brother    Breast cancer Brother    Breast cancer Brother    Breast cancer Brother     SOCIAL HISTORY: Social History   Socioeconomic History   Marital status: Widowed    Spouse name: Not on file   Number of children: 3   Years of education: 40   Highest education level: Bachelor's degree (e.g., BA, AB, BS)  Occupational History   Occupation: Retired  Tobacco Use   Smoking status: Never   Smokeless tobacco: Never  Vaping Use   Vaping status: Never Used  Substance and Sexual Activity   Alcohol use: No    Alcohol/week: 0.0 standard drinks of alcohol   Drug use: No   Sexual activity: Not on file  Other Topics Concern   Not on file  Social History Narrative   She is a widow, her husband was a Optician, dispensing.  She has 2 sons born 29 and 85 and a daughter born in 24.  1 of her sons is a Arboriculturist near [place].      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      No alcohol tobacco or drug use.  She has 4 glasses of tea or cups of tea a day.      Right-handed.   Social Determinants of Health   Financial Resource Strain: Low Risk  (09/23/2022)   Received from [facility], [facility]   Overall Financial Resource Strain (CARDIA)    Difficulty of Paying Living Expenses: Not hard at all  Food Insecurity: No Food Insecurity (09/23/2022)   Received from [facility], [facility]   Hunger Vital Sign    Worried About Running Out of Food in the Last Year: Never true    Ran Out of Food in the Last Year: Never true  Transportation Needs: No Transportation Needs (09/23/2022)   Received from [facility], [facility]   PRAPARE - Transportation    Lack of Transportation (Medical): No    Lack of Transportation (Non-Medical): No  Physical Activity: Not on file  Stress: Not on file  Social Connections: Unknown (11/15/2021)   Received from [facility], [facility]   Social Network    Social Network: Not on file  Intimate Partner Violence: Unknown (10/07/2021)   Received from [facility], [facility]   HITS    Physically Hurt: Not on file    Insult or Talk Down To: Not on file    Threaten Physical Harm: Not on file    Scream or Curse: Not on file      Levert Feinstein, M.D. Ph.D.  [facility] [address] [place], [place] [address] Ph: [phone] Fax: [phone]  CC:  Cleatis Polka., MD [address] [place],  [place] [address]  Cleatis Polka., MD

## 2023-04-28 ENCOUNTER — Telehealth: Payer: Self-pay | Admitting: Neurology

## 2023-04-28 NOTE — Telephone Encounter (Signed)
Called and spoke to patient about concerns, She states her pain is almost gone with medications but she is experiencing headaches, trouble with gathering thoughts and confusion (correctly gave DOB and todays date when asked), falls: she has had 2 since starting the medication, hit her head with one but not heard enough to give concerns just felt very off balance. She did seem to take a while to respond when asked questions but eventually gave them correctly. She states she is taking the 300mg  BID, and Gabapentin 300mg  but only at lunch and dinner. She did not give a reason as to why she was not taking it three times daily. She states she no long is in as much pain but she would like to leave her sisters number and verbal consent to speak with her if we need more information.I advised I would reach out to provider and let her know nay new recommendations.    Sister information is [phone]

## 2023-04-28 NOTE — Telephone Encounter (Signed)
She was with her daughter at the last visit on Oct 17th 2024, knows the risk and benefit of higher medication for her trigeminal neuralgia,   She may take trileptal 150mg  bid.  Also double check medication (dosage and time) she is taking now, per chart, also on wellbutrin and effexor,   Once her pain is better, may taper to lower dose.  Better discuss with her daughter.

## 2023-04-28 NOTE — Telephone Encounter (Signed)
Called and spoke to sister and neighbor, explained medication schedule and she was aware and we spoke about the schedule of meds and she was going to help her sister write out a medication schedule. I advised her to call us next week if anything changed or worsened. She was very appreciative and stated she would call next week. She had no questions at this time but was encouraged to call back if questions arise. She verbalized understanding.

## 2023-04-28 NOTE — Telephone Encounter (Signed)
Pt's neighbor called wanting to inform that the pt has become very confused, wobbly, unsteady and has had a couple of falls. She states the medications OXcarbazepine (TRILEPTAL) 150 MG tablet and the Gabapentin is causing these symptoms. The neighbor was with the pt at the time of the call and the pt agreed to a phone call back from RN or MD to discuss.

## 2023-05-10 DIAGNOSIS — Z1389 Encounter for screening for other disorder: Secondary | ICD-10-CM | POA: Diagnosis not present

## 2023-05-10 DIAGNOSIS — M8580 Other specified disorders of bone density and structure, unspecified site: Secondary | ICD-10-CM | POA: Diagnosis not present

## 2023-05-10 DIAGNOSIS — I10 Essential (primary) hypertension: Secondary | ICD-10-CM | POA: Diagnosis not present

## 2023-05-12 ENCOUNTER — Telehealth: Payer: Self-pay

## 2023-05-12 NOTE — Telephone Encounter (Signed)
Tried to contact daughter to talk about medication regimen but got the VM and left a message for a call back

## 2023-05-12 NOTE — Telephone Encounter (Signed)
Error

## 2023-05-12 NOTE — Telephone Encounter (Signed)
Patient daughter Jae Dire called back to speak with someone about pt medication - cb # [phone]

## 2023-05-12 NOTE — Telephone Encounter (Signed)
Patient called in stated she has changed her medication regimen. Stated she has now reduced her Trileptal to 1 in the AM and 1 in the PM, takes Gabapentin at 12 and 6 and takes her Effexor and Wellbutrin in the middle of that. I asked how she was feeling with this regimen and she stated she is still in a considerable amount of pain, but able to live with it. I asked why she reduced the Trileptal she did not have an answer, she said she called int o go over her new regimen to get some advice and see if we have any suggestions for her. Please advise.

## 2023-05-12 NOTE — Telephone Encounter (Signed)
I tried to call daughter back but I got the patient, I spoke to her about her schedule for meds as Dr. Terrace Arabia stated in notes. She is now going to try to spread out the effexor and gabapentin with the trileptal and see if spreading out the medications will help her have better coverage of the pain throughout the day. We were on the phone for an extended period of time until she came up with times to take the medications that worked best for her. She states if this does not help over the weekend she would call next week to update [place].

## 2023-05-14 ENCOUNTER — Other Ambulatory Visit: Payer: Self-pay | Admitting: Neurology

## 2023-05-17 DIAGNOSIS — I700 Atherosclerosis of aorta: Secondary | ICD-10-CM | POA: Diagnosis not present

## 2023-05-17 DIAGNOSIS — Z23 Encounter for immunization: Secondary | ICD-10-CM | POA: Diagnosis not present

## 2023-05-17 DIAGNOSIS — J479 Bronchiectasis, uncomplicated: Secondary | ICD-10-CM | POA: Diagnosis not present

## 2023-05-17 DIAGNOSIS — I10 Essential (primary) hypertension: Secondary | ICD-10-CM | POA: Diagnosis not present

## 2023-05-17 DIAGNOSIS — Z1339 Encounter for screening examination for other mental health and behavioral disorders: Secondary | ICD-10-CM | POA: Diagnosis not present

## 2023-05-17 DIAGNOSIS — Z1331 Encounter for screening for depression: Secondary | ICD-10-CM | POA: Diagnosis not present

## 2023-05-17 DIAGNOSIS — F3341 Major depressive disorder, recurrent, in partial remission: Secondary | ICD-10-CM | POA: Diagnosis not present

## 2023-05-17 DIAGNOSIS — Z0000 Encounter for general adult medical examination without abnormal findings: Secondary | ICD-10-CM | POA: Diagnosis not present

## 2023-05-17 DIAGNOSIS — J42 Unspecified chronic bronchitis: Secondary | ICD-10-CM | POA: Diagnosis not present

## 2023-05-17 DIAGNOSIS — E785 Hyperlipidemia, unspecified: Secondary | ICD-10-CM | POA: Diagnosis not present

## 2023-05-17 DIAGNOSIS — J45909 Unspecified asthma, uncomplicated: Secondary | ICD-10-CM | POA: Diagnosis not present

## 2023-05-17 DIAGNOSIS — D692 Other nonthrombocytopenic purpura: Secondary | ICD-10-CM | POA: Diagnosis not present

## 2023-05-23 NOTE — Telephone Encounter (Signed)
Call from Daughter to discuss what happened in last phone call with patient, she reports the patient has now stopped taking Wellbutrin and would like a surgical consult if at all possible. Daughter reports multiple falls and uncontrolled pain throughout the day and wants to see what next steps could entail.

## 2023-06-06 ENCOUNTER — Other Ambulatory Visit: Payer: Self-pay

## 2023-06-06 MED ORDER — GABAPENTIN 300 MG PO CAPS: 300.0000 mg | ORAL_CAPSULE | Freq: Three times a day (TID) | ORAL | 0 refills | Status: DC

## 2023-06-06 NOTE — Telephone Encounter (Signed)
Pt called and LVM stating that she is completely out of her Gabapentin and is needing it filled no later than this afternoon so that she can take her afternoon dose. Please advise.

## 2023-06-08 ENCOUNTER — Telehealth: Payer: Medicare PPO | Admitting: Neurology

## 2023-06-08 DIAGNOSIS — G500 Trigeminal neuralgia: Secondary | ICD-10-CM

## 2023-06-08 MED ORDER — GABAPENTIN 100 MG PO CAPS: 300.0000 mg | ORAL_CAPSULE | Freq: Three times a day (TID) | ORAL | 11 refills | Status: DC | PRN

## 2023-06-08 NOTE — Progress Notes (Signed)
No chief complaint on file.     ASSESSMENT AND PLAN  Haley Fuller is a 82 y.o. female   Right trigeminal neuralgia,   MRI of brain w/wo was normal in August 2024  Suboptimal response to gabapentin,  Responding much better with Trileptal, now on lower dose 150 mg in the morning, XR 150 mg at evening if needed, gabapentin up to 300 mg 3 times a day, complains of the side effect, dizziness, lightheadedness, but pain is under excellent control, Patient and daughter still want to have second opinion by neurosurgeon, refer to Dr. Brooke Pace, Lab    DIAGNOSTIC DATA (LABS, IMAGING, TESTING) - I reviewed patient records, labs, notes, testing and imaging myself where available.   MEDICAL HISTORY:  Haley Fuller, is a 82 year female, seen in request by her PCP Dr. Martha Clan for right trigeminal neuralgia.  I reviewed and summarized the referring note. PMHX. Depression HTN HLD  She had a history of right trigeminal neuralgia, initial episode was in November 2018, shooting pain to right lower molar, to the point of had the dental work at the right lower teeth without resolving her symptoms, occasionally also have radiating pain to her right cheek, right lateral nasal ala, initial episode last about few months, gradually improved, I saw her in April 2019, but at that time, her symptoms has much improved, decided not to proceed with any medication and evaluation at that time  She has been doing very well until 2023, she began to have intermittent right facial pain radiating to right lower cheek, usually improved by putting pressure at her right jaw area,  Her symptoms become much worse since January 03, 2023, slow worsening shooting pain, would not go away, difficulty talking, eating, brushing her tooth,  Tried gabapentin up to 600 mg twice daily, did help her pain, but complains of significant dizziness, lightheadedness, previously also have similar side effect even with low-dose of  Lyrica,  Since 2022, she developed mild left hand tremor, also complains of decreased sensation of smell, worsening constipations, also had vivid dreams, sister has dementia  UPDATE Apr 21 2023: She is a retired Administrator, arts for eighth grade, noticed mild memory loss, MoCA examination is 25/30 today, mild slow reaction time, well-preserved short-term memory  Following last visit in July 2024 she complains of significant right lower jaw pain, but was not able to follow the recommendation, has not even picked up the Trileptal prescription yet, she only take gabapentin 300 mg as needed, worry about the side effect of slower reaction, when she take 1 or 2 tablets, it does help her symptoms some  Because of her constant reported 10 out of 10 right lower jaw pain, she could not eat sleep or drink very well  Personally reviewed MRI of the brain with without in August 2024 that was normal,   Virtual Visit via video UPDATE June 08, 2023 I discussed the limitations of evaluation and management by telemedicine and the availability of in person appointments. The patient expressed understanding and agreed to proceed  Location: Provider: [facility] office; Patient: Home  I connected with Haley Fuller  on June 08, 2023 by a video enabled telemedicine application and verified that I am speaking with the correct person using two identifiers.  UPDATED HiSTORY Her right facial pain is under much better control, taking Trileptal 150 mg 8 AM, if needed at evening time, also gabapentin 300mg  at 12, 6pm, 9pm, she had side effect of dizziness, unsteady  gait, fell few times at the beginning of the treatment, overall has improved, feel cloudy minded  Previous MRI of the brain, MRI of the trigeminal nerve with without contrast in August 2024 was normal, normal TSH,     Observations/Objective: I have reviewed problem lists, medications, allergies. Awake, alert, oriented to history taking and casual  conversation, facial symmetric, no dysarthria, no aphasia, moving 4 extremities without difficulties.  REVIEW OF SYSTEMS:  Full 14 system review of systems performed and notable only for as above All other review of systems were negative.   ALLERGIES: Allergies  Allergen Reactions   Atorvastatin     Other Reaction(s): lichen planus    HOME MEDICATIONS: Current Outpatient Medications  Medication Sig Dispense Refill   albuterol (VENTOLIN HFA) 108 (90 Base) MCG/ACT inhaler INHALE 2 PUFFS EVERY 6 HOURS AS NEEDED FOR WHEEZING OR SHORTNESS OF BREATH.     atorvastatin (LIPITOR) 20 MG tablet Take by mouth.     budesonide-formoterol (SYMBICORT) 160-4.5 MCG/ACT inhaler Inhale 2 puffs into the lungs 2 (two) times daily as needed.      buPROPion (WELLBUTRIN XL) 150 MG 24 hr tablet Take 150 mg by mouth daily as needed.      gabapentin (NEURONTIN) 300 MG capsule Take 1 capsule (300 mg total) by mouth 3 (three) times daily. 90 capsule 0   glucosamine-chondroitin 500-400 MG tablet Take 1 tablet by mouth daily.     hydrochlorothiazide (HYDRODIURIL) 12.5 MG tablet Take 12.5 mg by mouth daily.  11   losartan (COZAAR) 50 MG tablet Take 50 mg by mouth 2 (two) times daily.     OXcarbazepine (TRILEPTAL) 150 MG tablet TAKE 2 TABLETS BY MOUTH 2 TIMES DAILY. 360 tablet 2   venlafaxine XR (EFFEXOR-XR) 75 MG 24 hr capsule Take 75 mg by mouth 2 (two) times daily.     No current facility-administered medications for this visit.    PAST MEDICAL HISTORY: Past Medical History:  Diagnosis Date   Asthma    Bilateral ankle fractures 2005   Depression    Hyperlipidemia    Hypertension    IFG (impaired fasting glucose)    Lichen planus    OA (osteoarthritis)    hands   Thrombophlebitis leg superficial    right leg   Trigeminal neuralgia    Varicose veins     PAST SURGICAL HISTORY: Past Surgical History:  Procedure Laterality Date   ABDOMINAL HYSTERECTOMY     partial   COLONOSCOPY     Last in 2014     FAMILY HISTORY: Family History  Problem Relation Age of Onset   Stroke Mother    Cancer Father        bladder   Breast cancer Sister    Breast cancer Brother    Breast cancer Brother    Breast cancer Brother    Breast cancer Brother     SOCIAL HISTORY: Social History   Socioeconomic History   Marital status: Widowed    Spouse name: Not on file   Number of children: 3   Years of education: 57   Highest education level: Bachelor's degree (e.g., BA, AB, BS)  Occupational History   Occupation: Retired  Tobacco Use   Smoking status: Never   Smokeless tobacco: Never  Vaping Use   Vaping status: Never Used  Substance and Sexual Activity   Alcohol use: No    Alcohol/week: 0.0 standard drinks of alcohol   Drug use: No   Sexual activity: Not on file  Other  Topics Concern   Not on file  Social History Narrative   She is a widow, her husband was a Optician, dispensing.  She has 2 sons born 70 and 87 and a daughter born in 60.  1 of her sons is a Arboriculturist near [place].      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      No alcohol tobacco or drug use.  She has 4 glasses of tea or cups of tea a day.      Right-handed.   Social Determinants of Health   Financial Resource Strain: Low Risk  (09/23/2022)   Received from [facility], [facility]   Overall Financial Resource Strain (CARDIA)    Difficulty of Paying Living Expenses: Not hard at all  Food Insecurity: No Food Insecurity (09/23/2022)   Received from [facility], [facility]   Hunger Vital Sign    Worried About Running Out of Food in the Last Year: Never true    Ran Out of Food in the Last Year: Never true  Transportation Needs: No Transportation Needs (09/23/2022)   Received from [facility], [facility]   PRAPARE - Transportation    Lack of Transportation (Medical): No    Lack of Transportation (Non-Medical): No  Physical  Activity: Not on file  Stress: Not on file  Social Connections: Unknown (11/15/2021)   Received from [facility], [facility]   Social Network    Social Network: Not on file  Intimate Partner Violence: Unknown (10/07/2021)   Received from [facility], [facility]   HITS    Physically Hurt: Not on file    Insult or Talk Down To: Not on file    Threaten Physical Harm: Not on file    Scream or Curse: Not on file      Levert Feinstein, M.D. Ph.D.  [facility] [address], [address] [place], [place] [address] Ph: [phone] Fax: [phone]  CC:  Cleatis Polka., MD [address] [place],  [place] [address]  Cleatis Polka., MD

## 2023-06-08 NOTE — Patient Instructions (Signed)
[employer] B. Angelyn Punt, MD, PhD  Address: [address] [facility], [place], [place] [address] Phone: [phone]

## 2023-06-13 ENCOUNTER — Telehealth: Payer: Self-pay

## 2023-06-13 ENCOUNTER — Other Ambulatory Visit (INDEPENDENT_AMBULATORY_CARE_PROVIDER_SITE_OTHER): Payer: Medicare PPO | Admitting: Neurology

## 2023-06-13 ENCOUNTER — Ambulatory Visit: Payer: Medicare PPO | Admitting: Neurology

## 2023-06-13 VITALS — BP 153/80 | HR 73

## 2023-06-13 DIAGNOSIS — R4189 Other symptoms and signs involving cognitive functions and awareness: Secondary | ICD-10-CM | POA: Diagnosis not present

## 2023-06-13 DIAGNOSIS — F32A Depression, unspecified: Secondary | ICD-10-CM | POA: Diagnosis not present

## 2023-06-13 DIAGNOSIS — G500 Trigeminal neuralgia: Secondary | ICD-10-CM

## 2023-06-13 DIAGNOSIS — F419 Anxiety disorder, unspecified: Secondary | ICD-10-CM | POA: Insufficient documentation

## 2023-06-13 NOTE — Progress Notes (Unsigned)
No chief complaint on file.     ASSESSMENT AND PLAN  Haley Fuller is a 82 y.o. female   Right trigeminal neuralgia,   MRI of brain w/wo was normal in August 2024  Suboptimal response to gabapentin,  Responding much better with Trileptal, now on lower dose 150 mg in the morning, XR 150 mg at evening if needed, gabapentin up to 300 mg 3 times a day, complains of the side effect, dizziness, lightheadedness, but pain is under excellent control, Patient and daughter still want to have second opinion by neurosurgeon, refer to Dr. Brooke Pace, Lab    DIAGNOSTIC DATA (LABS, IMAGING, TESTING) - I reviewed patient records, labs, notes, testing and imaging myself where available.   MEDICAL HISTORY:  Haley Fuller, is a 82 year female, seen in request by her PCP Dr. Martha Clan for right trigeminal neuralgia.  I reviewed and summarized the referring note. PMHX. Depression HTN HLD  She had a history of right trigeminal neuralgia, initial episode was in November 2018, shooting pain to right lower molar, to the point of had the dental work at the right lower teeth without resolving her symptoms, occasionally also have radiating pain to her right cheek, right lateral nasal ala, initial episode last about few months, gradually improved, I saw her in April 2019, but at that time, her symptoms has much improved, decided not to proceed with any medication and evaluation at that time  She has been doing very well until 2023, she began to have intermittent right facial pain radiating to right lower cheek, usually improved by putting pressure at her right jaw area,  Her symptoms become much worse since January 03, 2023, slow worsening shooting pain, would not go away, difficulty talking, eating, brushing her tooth,  Tried gabapentin up to 600 mg twice daily, did help her pain, but complains of significant dizziness, lightheadedness, previously also have similar side effect even with low-dose of  Lyrica,  Since 2022, she developed mild left hand tremor, also complains of decreased sensation of smell, worsening constipations, also had vivid dreams, sister has dementia  UPDATE Apr 21 2023: She is a retired Administrator, arts for eighth grade, noticed mild memory loss, MoCA examination is 25/30 today, mild slow reaction time, well-preserved short-term memory  Following last visit in July 2024 she complains of significant right lower jaw pain, but was not able to follow the recommendation, has not even picked up the Trileptal prescription yet, she only take gabapentin 300 mg as needed, worry about the side effect of slower reaction, when she take 1 or 2 tablets, it does help her symptoms some  Because of her constant reported 10 out of 10 right lower jaw pain, she could not eat sleep or drink very well  Personally reviewed MRI of the brain with without in August 2024 that was normal,   Virtual Visit via video UPDATE June 08, 2023 Her right facial pain is under much better control, taking Trileptal 150 mg 8 AM, if needed at evening time, also gabapentin 300mg  at 12, 6pm, 9pm, she had side effect of dizziness, unsteady gait, fell few times at the beginning of the treatment, overall has improved, feel cloudy minded  Previous MRI of the brain, MRI of the trigeminal nerve with without contrast in August 2024 was normal, normal TSH,  UPDATE Dec 9th 2024: She is now taking trileptal xr 150mg  bid, facial pain is under good control, came in for lab today  Complains of worsening depression,  memory loss, strong family history of mood disorder, older sister has bipolar, she has been treated with Wellbutrin and Effexor for many years for many years,   [place] farm, but separate house with her sister  She has excessive vivid dreams, moving a lot during her sleep, lost most sense of smell, sometimes, could not tell dreams from reality, dreamed her daughter will come to have lunch with her,  thought she is truly coming.      PHYSICAL EXAMNIATION:    04/21/2023    9:00 AM 01/21/2023    8:22 AM 06/09/2022    8:12 AM  Vitals with BMI  Height 5' 6.5" 5\' 6"    Weight 166 lbs 167 lbs 8 oz   BMI 26.39 27.05   Systolic 157 150 782  Diastolic 83 88 70  Pulse 67 122      Gen: NAD, conversant, well nourised, well groomed                     Cardiovascular: Regular rate rhythm, no peripheral edema, warm, nontender. Eyes: Conjunctivae clear without exudates or hemorrhage Neck: Supple, no carotid bruits. Pulmonary: Clear to auscultation bilaterally   NEUROLOGICAL EXAM:  MENTAL STATUS: Speech/cognition: Slow talking,  oriented to history taking and casual conversation  CRANIAL NERVES: CN II: Visual fields are full to confrontation.  Pupils are round equal and briskly reactive to light. CN III, IV, VI: extraocular movement are normal. No ptosis. CN V: Facial sensation is intact to pinprick in all 3 divisions bilaterally. Corneal responses are intact.  CN VII: Face is symmetric with normal eye closure and smile. CN VIII: Hearing is normal to casual conversation CN IX, X: Palate elevates symmetrically. Phonation is normal. CN XI: Head turning and shoulder shrug are intact CN XII: Tongue is midline with normal movements and no atrophy.  MOTOR:   REFLEXES: Reflexes are 2+ and symmetric at the biceps, triceps, knees, and ankles. Plantar responses are flexor.  SENSORY: Intact to light touch, pinprick, positional and vibratory sensation are intact in fingers and toes.  COORDINATION: Rapid alternating movements and fine finger movements are intact. There is no dysmetria on finger-to-nose and heel-knee-shin.    GAIT/STANCE:      REVIEW OF SYSTEMS:  Full 14 system review of systems performed and notable only for as above All other review of systems were negative.   ALLERGIES: Allergies  Allergen Reactions   Atorvastatin     Other Reaction(s): lichen planus    HOME  MEDICATIONS: Current Outpatient Medications  Medication Sig Dispense Refill   albuterol (VENTOLIN HFA) 108 (90 Base) MCG/ACT inhaler INHALE 2 PUFFS EVERY 6 HOURS AS NEEDED FOR WHEEZING OR SHORTNESS OF BREATH.     atorvastatin (LIPITOR) 20 MG tablet Take by mouth.     budesonide-formoterol (SYMBICORT) 160-4.5 MCG/ACT inhaler Inhale 2 puffs into the lungs 2 (two) times daily as needed.      buPROPion (WELLBUTRIN XL) 150 MG 24 hr tablet Take 150 mg by mouth daily as needed.      gabapentin (NEURONTIN) 100 MG capsule Take 3 capsules (300 mg total) by mouth 3 (three) times daily as needed. 270 capsule 11   glucosamine-chondroitin 500-400 MG tablet Take 1 tablet by mouth daily.     hydrochlorothiazide (HYDRODIURIL) 12.5 MG tablet Take 12.5 mg by mouth daily.  11   losartan (COZAAR) 50 MG tablet Take 50 mg by mouth 2 (two) times daily.     OXcarbazepine (TRILEPTAL) 150 MG tablet  TAKE 2 TABLETS BY MOUTH 2 TIMES DAILY. 360 tablet 2   venlafaxine XR (EFFEXOR-XR) 75 MG 24 hr capsule Take 75 mg by mouth 2 (two) times daily.     No current facility-administered medications for this visit.    PAST MEDICAL HISTORY: Past Medical History:  Diagnosis Date   Asthma    Bilateral ankle fractures 2005   Depression    Hyperlipidemia    Hypertension    IFG (impaired fasting glucose)    Lichen planus    OA (osteoarthritis)    hands   Thrombophlebitis leg superficial    right leg   Trigeminal neuralgia    Varicose veins     PAST SURGICAL HISTORY: Past Surgical History:  Procedure Laterality Date   ABDOMINAL HYSTERECTOMY     partial   COLONOSCOPY     Last in 2014    FAMILY HISTORY: Family History  Problem Relation Age of Onset   Stroke Mother    Cancer Father        bladder   Breast cancer Sister    Breast cancer Brother    Breast cancer Brother    Breast cancer Brother    Breast cancer Brother     SOCIAL HISTORY: Social History   Socioeconomic History   Marital status: Widowed     Spouse name: Not on file   Number of children: 3   Years of education: 54   Highest education level: Bachelor's degree (e.g., BA, AB, BS)  Occupational History   Occupation: Retired  Tobacco Use   Smoking status: Never   Smokeless tobacco: Never  Vaping Use   Vaping status: Never Used  Substance and Sexual Activity   Alcohol use: No    Alcohol/week: 0.0 standard drinks of alcohol   Drug use: No   Sexual activity: Not on file  Other Topics Concern   Not on file  Social History Narrative   She is a widow, her husband was a Optician, dispensing.  She has 2 sons born 10 and 60 and a daughter born in 60.  1 of her sons is a Arboriculturist near [place].      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      No alcohol tobacco or drug use.  She has 4 glasses of tea or cups of tea a day.      Right-handed.   Social Determinants of Health   Financial Resource Strain: Low Risk  (09/23/2022)   Received from [facility], [facility]   Overall Financial Resource Strain (CARDIA)    Difficulty of Paying Living Expenses: Not hard at all  Food Insecurity: No Food Insecurity (09/23/2022)   [facility] from [facility], [facility]   Hunger Vital Sign    Worried About Running Out of Food in the Last Year: Never true    Ran Out of Food in the Last Year: Never true  Transportation Needs: No Transportation Needs (09/23/2022)   Received from [facility], [facility]   PRAPARE - Transportation    Lack of Transportation (Medical): No    Lack of Transportation (Non-Medical): No  Physical Activity: Not on file  Stress: Not on file  Social Connections: Unknown (11/15/2021)   Received from [facility], [facility]   Social Network    Social Network: Not on file  Intimate Partner Violence: Unknown (10/07/2021)   Received from [facility], [facility]   HITS  Physically Hurt: Not on file    Insult or Talk Down To:  Not on file    Threaten Physical Harm: Not on file    Scream or Curse: Not on file      Levert Feinstein, M.D. Ph.D.  [facility] [address], [address] [place], [place] [address] Ph: [phone] Fax: [phone]  CC:  Cleatis Polka., MD [address] [place],  [place] [address]  Cleatis Polka., MD

## 2023-06-13 NOTE — Patient Instructions (Addendum)
Current Outpatient Medications on File Prior to Visit  Medication Sig   albuterol (VENTOLIN HFA) 108 (90 Base) MCG/ACT inhaler INHALE 2 PUFFS EVERY 6 HOURS AS NEEDED FOR WHEEZING OR SHORTNESS OF BREATH.   atorvastatin (LIPITOR) 20 MG tablet Take by mouth.   budesonide-formoterol (SYMBICORT) 160-4.5 MCG/ACT inhaler Inhale 2 puffs into the lungs 2 (two) times daily as needed.    buPROPion (WELLBUTRIN XL) 150 MG 24 hr tablet Take 150 mg by mouth daily as needed.    gabapentin (NEURONTIN) 100 MG capsule Take 3 capsules (300 mg total) by mouth 3 (three) times daily as needed. If your facial pain is not under good control   glucosamine-chondroitin 500-400 MG tablet Take 1 tablet by mouth daily.   hydrochlorothiazide (HYDRODIURIL) 12.5 MG tablet Take 12.5 mg by mouth daily.   losartan (COZAAR) 50 MG tablet Take 50 mg by mouth 2 (two) times daily.   OXcarbazepine (TRILEPTAL) 150 MG tablet TAKE 2 TABLETS BY MOUTH 2 TIMES DAILY.   venlafaxine XR (EFFEXOR-XR) 75 MG 24 hr capsule Take 75 mg by mouth 2 (two) times daily.   You should take Oxcarbazepine xr 150mg  twice a day at 8am, 10pm Only take Gabapentin 100mg  as needed if you have facial pain.   If your facial pain is under good control, may skip night dose of oxcarbazepine.

## 2023-06-13 NOTE — Progress Notes (Signed)
ASSESSMENT AND PLAN  Haley Fuller is a 82 y.o. female   Right trigeminal neuralgia,   MRI of brain w/wo was normal in August 2024  Previously reported suboptimal response to gabapentin,  Responding well with Trileptal XR 150 mg twice a day, gabapentin 100 mg as needed Patient and daughter still want to have second opinion by neurosurgeon, refer to Dr. Brooke Pace, Depression anxiety, mild cognitive impairment   MoCA examination 21/30  Laboratory evaluation including B12,  Continue follow-up with her psychiatrist optimize her anxiety control  Advised her sun exposure, moderate exercise daily  Return To Clinic With NP In 6 Months    DIAGNOSTIC DATA (LABS, IMAGING, TESTING) - I reviewed patient records, labs, notes, testing and imaging myself where available.   MEDICAL HISTORY:  Haley Fuller, is a 82 year female, seen in request by her PCP Dr. Martha Clan for right trigeminal neuralgia.  I reviewed and summarized the referring note. PMHX. Depression HTN HLD  She had a history of right trigeminal neuralgia, initial episode was in November 2018, shooting pain to right lower molar, to the point of had the dental work at the right lower teeth without resolving her symptoms, occasionally also have radiating pain to her right cheek, right lateral nasal ala, initial episode last about few months, gradually improved, I saw her in April 2019, but at that time, her symptoms has much improved, decided not to proceed with any medication and evaluation at that time  She has been doing very well until 2023, she began to have intermittent right facial pain radiating to right lower cheek, usually improved by putting pressure at her right jaw area,  Her symptoms become much worse since January 03, 2023, slow worsening shooting pain, would not go away, difficulty talking, eating, brushing her tooth,  Tried gabapentin up to 600 mg twice daily, did help her pain, but complains of significant  dizziness, lightheadedness, previously also have similar side effect even with low-dose of Lyrica,  Since 2022, she developed mild left hand tremor, also complains of decreased sensation of smell, worsening constipations, also had vivid dreams, sister has dementia  UPDATE Apr 21 2023: She is a retired Administrator, arts for eighth grade, noticed mild memory loss, MoCA examination is 25/30 today, mild slow reaction time, well-preserved short-term memory  Following last visit in July 2024 she complains of significant right lower jaw pain, but was not able to follow the recommendation, has not even picked up the Trileptal prescription yet, she only take gabapentin 300 mg as needed, worry about the side effect of slower reaction, when she take 1 or 2 tablets, it does help her symptoms some  Because of her constant reported 10 out of 10 right lower jaw pain, she could not eat sleep or drink very well  Personally reviewed MRI of the brain with without in August 2024 that was normal,   Virtual Visit via video UPDATE June 08, 2023 Her right facial pain is under much better control, taking Trileptal 150 mg 8 AM, if needed at evening time, also gabapentin 300mg  at 12, 6pm, 9pm, she had side effect of dizziness, unsteady gait, fell few times at the beginning of the treatment, overall has improved, feel cloudy minded  Previous MRI of the brain, MRI of the trigeminal nerve with without contrast in August 2024 was normal, normal TSH,  Update June 13, 2023 She drove herself to clinic today, trigeminal neuralgia is under good control taking Trileptal XR 150 mg twice  a day, gabapentin as needed,  Today she complains of worsening depression anxiety, is under the care of psychiatrist, has been treated with Wellbutrin, Effexor for many years, strong family history of mood disorder, her older sister suffered bipolar disorder, she is now living on her farm, her sister live close by, limited daily, walk 30 minutes  every day, in the past few months, she noticed worsening depression, concurrent with her worsening facial pain See psychiatrist soon  She is also concerned about her memory loss, she is a retired Ecologist, word finding difficulties, difficulty managing her medications, slower reaction time, she also complains of vivid dreams, excessive movement during sleep, sometimes woke up from dream could not tell it apart from reality,  She has lost sense of smell,  PHYSICAL EXAMNIATION:    06/13/2023   12:20 PM 04/21/2023    9:00 AM 01/21/2023    8:22 AM  Vitals with BMI  Height  5' 6.5" 5\' 6"   Weight  166 lbs 167 lbs 8 oz  BMI  26.39 27.05  Systolic 153 157 629  Diastolic 80 83 88  Pulse 73 67 122     Gen: NAD, conversant, well nourised, well groomed                     Cardiovascular: Regular rate rhythm, no peripheral edema, warm, nontender. Eyes: Conjunctivae clear without exudates or hemorrhage Neck: Supple, no carotid bruits. Pulmonary: Clear to auscultation bilaterally   NEUROLOGICAL EXAM:  MENTAL STATUS: Speech/cognition: Awake, alert oriented to history taking and casual conversation    06/13/2023   12:00 PM 04/21/2023    9:00 AM  Montreal Cognitive Assessment Blind  Attention: Read list of digits (0/2) 2 2  Attention: Read list of letters (0/1) 1 1  Attention: Serial 7 subtraction starting at 100 (0/3) 3 2  Language: Repeat phrase (0/2) 2 2  Language : Fluency (0/1) 0 0  Abstraction (0/2) 2 2  Delayed Recall (0/5) 5 5  Orientation (0/6) 6 6  Total 21 20     CRANIAL NERVES: CN II: Visual fields are full to confrontation.  Pupils are round equal and briskly reactive to light. CN III, IV, VI: extraocular movement are normal. No ptosis. CN V: Facial sensation is intact to pinprick in all 3 divisions bilaterally. Corneal responses are intact.  CN VII: Face is symmetric with normal eye closure and smile. CN VIII: Hearing is normal to casual  conversation CN IX, X: Palate elevates symmetrically. Phonation is normal. CN XI: Head turning and shoulder shrug are intact CN XII: Tongue is midline with normal movements and no atrophy.  MOTOR: There is no pronator drift of out-stretched arms. Muscle bulk and tone are normal. Muscle strength is normal.  REFLEXES: Reflexes are 2+ and symmetric at the biceps, triceps, knees, and ankles. Plantar responses are flexor.  SENSORY: Intact to light touch, pinprick, positional and vibratory sensation are intact in fingers and toes.  COORDINATION: Rapid alternating movements and fine finger movements are intact. There is no dysmetria on finger-to-nose and heel-knee-shin.    GAIT/STANCE: Able to get up from seated position arm crossed, lean forward, steady, moderate stride,    REVIEW OF SYSTEMS:  Full 14 system review of systems performed and notable only for as above All other review of systems were negative.   ALLERGIES: Allergies  Allergen Reactions   Atorvastatin     Other Reaction(s): lichen planus    HOME MEDICATIONS: Current Outpatient Medications  Medication Sig Dispense Refill   albuterol (VENTOLIN HFA) 108 (90 Base) MCG/ACT inhaler INHALE 2 PUFFS EVERY 6 HOURS AS NEEDED FOR WHEEZING OR SHORTNESS OF BREATH.     atorvastatin (LIPITOR) 20 MG tablet Take by mouth.     budesonide-formoterol (SYMBICORT) 160-4.5 MCG/ACT inhaler Inhale 2 puffs into the lungs 2 (two) times daily as needed.      buPROPion (WELLBUTRIN XL) 150 MG 24 hr tablet Take 150 mg by mouth daily as needed.      gabapentin (NEURONTIN) 100 MG capsule Take 3 capsules (300 mg total) by mouth 3 (three) times daily as needed. 270 capsule 11   glucosamine-chondroitin 500-400 MG tablet Take 1 tablet by mouth daily.     hydrochlorothiazide (HYDRODIURIL) 12.5 MG tablet Take 12.5 mg by mouth daily.  11   losartan (COZAAR) 50 MG tablet Take 50 mg by mouth 2 (two) times daily.     OXcarbazepine (TRILEPTAL) 150 MG tablet  TAKE 2 TABLETS BY MOUTH 2 TIMES DAILY. 360 tablet 2   venlafaxine XR (EFFEXOR-XR) 75 MG 24 hr capsule Take 75 mg by mouth 2 (two) times daily.     No current facility-administered medications for this visit.    PAST MEDICAL HISTORY: Past Medical History:  Diagnosis Date   Asthma    Bilateral ankle fractures 2005   Depression    Hyperlipidemia    Hypertension    IFG (impaired fasting glucose)    Lichen planus    OA (osteoarthritis)    hands   Thrombophlebitis leg superficial    right leg   Trigeminal neuralgia    Varicose veins     PAST SURGICAL HISTORY: Past Surgical History:  Procedure Laterality Date   ABDOMINAL HYSTERECTOMY     partial   COLONOSCOPY     Last in 2014    FAMILY HISTORY: Family History  Problem Relation Age of Onset   Stroke Mother    Cancer Father        bladder   Breast cancer Sister    Breast cancer Brother    Breast cancer Brother    Breast cancer Brother    Breast cancer Brother     SOCIAL HISTORY: Social History   Socioeconomic History   Marital status: Widowed    Spouse name: Not on file   Number of children: 3   Years of education: 28   Highest education level: Bachelor's degree (e.g., BA, AB, BS)  Occupational History   Occupation: Retired  Tobacco Use   Smoking status: Never   Smokeless tobacco: Never  Vaping Use   Vaping status: Never Used  Substance and Sexual Activity   Alcohol use: No    Alcohol/week: 0.0 standard drinks of alcohol   Drug use: No   Sexual activity: Not on file  Other Topics Concern   Not on file  Social History Narrative   She is a widow, her husband was a Optician, dispensing.  She has 2 sons born 54 and 51 and a daughter born in 40.  1 of her sons is a Arboriculturist near [place].      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      No alcohol tobacco or drug use.  She has 4 glasses of tea or cups of tea a day.       Right-handed.   Social Determinants of Health   Financial Resource Strain: Low Risk  (09/23/2022)  Received from [facility], [facility]   Overall Financial Resource Strain (CARDIA)    Difficulty of Paying Living Expenses: Not hard at all  Food Insecurity: No Food Insecurity (09/23/2022)   Received from [facility], [facility]   Hunger Vital Sign    Worried About Running Out of Food in the Last Year: Never true    Ran Out of Food in the Last Year: Never true  Transportation Needs: No Transportation Needs (09/23/2022)   Received from [facility], [facility]   [facility] - Transportation    Lack of Transportation (Medical): No    Lack of Transportation (Non-Medical): No  Physical Activity: Not on file  Stress: Not on file  Social Connections: Unknown (11/15/2021)   Received from [facility], [facility]   Social Network    Social Network: Not on file  Intimate Partner Violence: Unknown (10/07/2021)   Received from [facility], [facility]   HITS    Physically Hurt: Not on file    Insult or Talk Down To: Not on file    Threaten Physical Harm: Not on file    Scream or Curse: Not on file      Levert Feinstein, M.D. Ph.D.  [facility] [address] [place], [place] [address] Ph: [phone] Fax: [phone]  CC:  Cleatis Polka., MD [address] [place],  [place] [address]  Cleatis Polka., MD

## 2023-06-13 NOTE — Telephone Encounter (Signed)
Patient came in for lab work, had medication questions. Joni Reining in lab let me know and I took patient in room in lobby. States since last visit with Dr. Terrace Arabia she is not sure what is going on but feels depressed and has voiced SI but denies having a plan yet. I brought patient back to a visit in room in office and made Dr. Terrace Arabia aware of patient concerns. Spoke with Dr. Terrace Arabia and will add her on for a visit.

## 2023-06-14 LAB — CBC WITH DIFFERENTIAL/PLATELET
Basophils Absolute: 0 10*3/uL (ref 0.0–0.2)
Basos: 1 %
EOS (ABSOLUTE): 0.1 10*3/uL (ref 0.0–0.4)
Eos: 4 %
Hematocrit: 40.9 % (ref 34.0–46.6)
Hemoglobin: 13.8 g/dL (ref 11.1–15.9)
Immature Grans (Abs): 0 10*3/uL (ref 0.0–0.1)
Immature Granulocytes: 0 %
Lymphocytes Absolute: 1.2 10*3/uL (ref 0.7–3.1)
Lymphs: 31 %
MCH: 30.5 pg (ref 26.6–33.0)
MCHC: 33.7 g/dL (ref 31.5–35.7)
MCV: 91 fL (ref 79–97)
Monocytes Absolute: 0.5 10*3/uL (ref 0.1–0.9)
Monocytes: 13 %
Neutrophils Absolute: 1.9 10*3/uL (ref 1.4–7.0)
Neutrophils: 51 %
Platelets: 230 10*3/uL (ref 150–450)
RBC: 4.52 x10E6/uL (ref 3.77–5.28)
RDW: 12.3 % (ref 11.7–15.4)
WBC: 3.7 10*3/uL (ref 3.4–10.8)

## 2023-06-14 LAB — COMPREHENSIVE METABOLIC PANEL
ALT: 24 [IU]/L (ref 0–32)
AST: 32 [IU]/L (ref 0–40)
Albumin: 4.3 g/dL (ref 3.8–4.8)
Alkaline Phosphatase: 112 [IU]/L (ref 44–121)
BUN/Creatinine Ratio: 22 (ref 12–28)
BUN: 17 mg/dL (ref 8–27)
Bilirubin Total: 0.5 mg/dL (ref 0.0–1.2)
CO2: 23 mmol/L (ref 20–29)
Calcium: 9.7 mg/dL (ref 8.7–10.3)
Chloride: 100 mmol/L (ref 96–106)
Creatinine, Ser: 0.79 mg/dL (ref 0.57–1.00)
Globulin, Total: 2.2 g/dL (ref 1.5–4.5)
Glucose: 94 mg/dL (ref 70–99)
Potassium: 4.8 mmol/L (ref 3.5–5.2)
Sodium: 136 mmol/L (ref 134–144)
Total Protein: 6.5 g/dL (ref 6.0–8.5)
eGFR: 76 mL/min/{1.73_m2} (ref >59)

## 2023-06-14 LAB — FOLATE: Folate: 13.7 ng/mL (ref >3.0)

## 2023-06-14 LAB — VITAMIN B12: Vitamin B-12: 397 pg/mL (ref 232–1245)

## 2023-06-15 ENCOUNTER — Telehealth: Payer: Self-pay | Admitting: Neurology

## 2023-06-15 NOTE — Telephone Encounter (Signed)
Referral  for neurosurgery fax to [facility]. Phone: [phone], Fax: [phone] 

## 2023-06-21 ENCOUNTER — Other Ambulatory Visit: Payer: Self-pay | Admitting: Internal Medicine

## 2023-06-21 ENCOUNTER — Encounter: Payer: Self-pay | Admitting: Internal Medicine

## 2023-06-21 DIAGNOSIS — Z1231 Encounter for screening mammogram for malignant neoplasm of breast: Secondary | ICD-10-CM

## 2023-06-30 DIAGNOSIS — G500 Trigeminal neuralgia: Secondary | ICD-10-CM | POA: Diagnosis not present

## 2023-07-14 DIAGNOSIS — G500 Trigeminal neuralgia: Secondary | ICD-10-CM | POA: Diagnosis not present

## 2023-07-14 DIAGNOSIS — Z510 Encounter for antineoplastic radiation therapy: Secondary | ICD-10-CM | POA: Diagnosis not present

## 2023-07-20 ENCOUNTER — Ambulatory Visit: Payer: Medicare PPO

## 2023-08-01 ENCOUNTER — Telehealth: Payer: Self-pay | Admitting: Neurology

## 2023-08-01 NOTE — Telephone Encounter (Signed)
Pt checking status of refill. States she hasn't started weaning off  of them and is needing the 300 mg because she has plenty of refills for the 100 mg

## 2023-08-09 ENCOUNTER — Ambulatory Visit
Admission: RE | Admit: 2023-08-09 | Discharge: 2023-08-09 | Disposition: A | Discharge status: Home / Self Care | Payer: Medicare PPO | Source: Ambulatory Visit | Attending: Internal Medicine | Admitting: Internal Medicine

## 2023-08-09 DIAGNOSIS — Z1231 Encounter for screening mammogram for malignant neoplasm of breast: Secondary | ICD-10-CM

## 2023-08-15 ENCOUNTER — Telehealth: Payer: Self-pay | Admitting: Neurology

## 2023-08-15 MED ORDER — GABAPENTIN 100 MG PO CAPS: 300.0000 mg | ORAL_CAPSULE | Freq: Three times a day (TID) | ORAL | 11 refills | Status: DC | PRN

## 2023-08-15 NOTE — Telephone Encounter (Signed)
 Pt request refill for gabapentin  (NEURONTIN ) 100 MG capsule send to  [facility] [phone]

## 2023-08-15 NOTE — Telephone Encounter (Signed)
 refilled

## 2023-08-17 ENCOUNTER — Other Ambulatory Visit: Payer: Self-pay | Admitting: Neurology

## 2023-08-17 ENCOUNTER — Telehealth: Payer: Self-pay | Admitting: Neurology

## 2023-08-17 NOTE — Telephone Encounter (Signed)
Pt called requesting a call back from RN or MD to discuss her gabapentin (NEURONTIN) 100 MG capsule she states she is needing a increase in dosage. Please advise.

## 2023-08-17 NOTE — Telephone Encounter (Signed)
Call to patient, no answer. Left a message to return call to office

## 2023-08-18 ENCOUNTER — Encounter: Payer: Self-pay | Admitting: Neurology

## 2023-08-18 MED ORDER — GABAPENTIN 300 MG PO CAPS: 300.0000 mg | ORAL_CAPSULE | Freq: Three times a day (TID) | ORAL | 3 refills | Status: DC

## 2023-08-18 NOTE — Telephone Encounter (Signed)
Pt called in and stated that she has been having some pain reoccur from the trigeminal neuralgia and its inher jaw and is getting worse she requesting to increase gabapentin. She is requesting rx be switched to the 300mg  tablet tid. She had [facility] knife [facility]/14/[facility]. Feels like she is back at square one.

## 2023-08-18 NOTE — Telephone Encounter (Signed)
Meds ordered this encounter  Medications   gabapentin (NEURONTIN) 300 MG capsule    Sig: Take 1 capsule (300 mg total) by mouth 3 (three) times daily.    Dispense:  270 capsule    Refill:  3

## 2023-08-18 NOTE — Addendum Note (Signed)
Addended by: Levert Feinstein on: 08/18/2023 12:22 PM   Modules accepted: Orders

## 2023-08-18 NOTE — Telephone Encounter (Signed)
Pt returned phone call, transferred to POD2

## 2023-08-22 ENCOUNTER — Ambulatory Visit: Payer: Medicare PPO | Admitting: Neurology

## 2023-08-22 ENCOUNTER — Telehealth: Payer: Self-pay | Admitting: Neurology

## 2023-08-22 DIAGNOSIS — F331 Major depressive disorder, recurrent, moderate: Secondary | ICD-10-CM | POA: Diagnosis not present

## 2023-08-22 DIAGNOSIS — F5104 Psychophysiologic insomnia: Secondary | ICD-10-CM | POA: Diagnosis not present

## 2023-08-22 DIAGNOSIS — G500 Trigeminal neuralgia: Secondary | ICD-10-CM

## 2023-08-22 MED ORDER — OXCARBAZEPINE 150 MG PO TABS: 300.0000 mg | ORAL_TABLET | Freq: Three times a day (TID) | ORAL | 2 refills | Status: DC

## 2023-08-22 MED ORDER — GABAPENTIN 300 MG PO CAPS: 300.0000 mg | ORAL_CAPSULE | Freq: Four times a day (QID) | ORAL | 3 refills | Status: DC

## 2023-08-22 NOTE — Telephone Encounter (Signed)
Call to daughter Jae Dire, she added her mother to the call. Patient was explaining the medications she was on and taking and that was different than prescribed. She is currently taking trileptal 150 mg, 2 tablets 3 times daily and gabapentin 300 mg 4 times daily. She states that is keeping her pain under control. Reviewed medication precautions and safety and advised I would reach out to Dr. Terrace Arabia for approval if agreeable.   Verbal discussion with Dr. Terrace Arabia, she is agreeable to mediation as above mentioned. Patient to come in for lab work next week.   Daughter and patient aware of medication doses and administration and agreeable to pill box and daughter managing medications. New scripts sent and pharmacy called to reconcile medication list,spoke wit Katrina. Mychart message sent for daughter and patient reference for medications and doses.

## 2023-08-22 NOTE — Telephone Encounter (Signed)
Pt's daughter is asking for a call to discuss with RN her concern of pt increasing her pain medication as a result of an increase in jaw pain from Trigeminal neuralgia , please call Jae Dire at [phone]

## 2023-08-22 NOTE — Telephone Encounter (Signed)
Meds ordered this encounter  Medications   gabapentin (NEURONTIN) 300 MG capsule    Sig: Take 1 capsule (300 mg total) by mouth 4 (four) times daily.    Dispense:  360 capsule    Refill:  3   OXcarbazepine (TRILEPTAL) 150 MG tablet    Sig: Take 2 tablets (300 mg total) by mouth 3 (three) times daily.    Dispense:  180 tablet    Refill:  2     Orders Placed This Encounter  Procedures   Comprehensive metabolic panel   CBC with Differential/Platelet   10-Hydroxycarbazepine

## 2023-08-26 ENCOUNTER — Encounter (HOSPITAL_COMMUNITY): Payer: Self-pay | Admitting: *Deleted

## 2023-08-26 ENCOUNTER — Other Ambulatory Visit: Payer: Self-pay

## 2023-08-26 ENCOUNTER — Emergency Department (HOSPITAL_COMMUNITY): Payer: Medicare PPO

## 2023-08-26 ENCOUNTER — Inpatient Hospital Stay (HOSPITAL_COMMUNITY)
Admission: EM | Admit: 2023-08-26 | Discharge: 2023-08-29 | DRG: 644 | Disposition: A | Discharge status: Home Health | Payer: Medicare PPO | Attending: Internal Medicine | Admitting: Internal Medicine

## 2023-08-26 DIAGNOSIS — E222 Syndrome of inappropriate secretion of antidiuretic hormone: Principal | ICD-10-CM | POA: Diagnosis present

## 2023-08-26 DIAGNOSIS — J45909 Unspecified asthma, uncomplicated: Secondary | ICD-10-CM | POA: Diagnosis present

## 2023-08-26 DIAGNOSIS — M47816 Spondylosis without myelopathy or radiculopathy, lumbar region: Secondary | ICD-10-CM | POA: Diagnosis not present

## 2023-08-26 DIAGNOSIS — Z7951 Long term (current) use of inhaled steroids: Secondary | ICD-10-CM | POA: Diagnosis not present

## 2023-08-26 DIAGNOSIS — R4182 Altered mental status, unspecified: Secondary | ICD-10-CM

## 2023-08-26 DIAGNOSIS — Z823 Family history of stroke: Secondary | ICD-10-CM

## 2023-08-26 DIAGNOSIS — G500 Trigeminal neuralgia: Secondary | ICD-10-CM | POA: Diagnosis present

## 2023-08-26 DIAGNOSIS — Z809 Family history of malignant neoplasm, unspecified: Secondary | ICD-10-CM | POA: Diagnosis not present

## 2023-08-26 DIAGNOSIS — I10 Essential (primary) hypertension: Secondary | ICD-10-CM | POA: Diagnosis present

## 2023-08-26 DIAGNOSIS — F32A Depression, unspecified: Secondary | ICD-10-CM | POA: Diagnosis present

## 2023-08-26 DIAGNOSIS — Z8672 Personal history of thrombophlebitis: Secondary | ICD-10-CM | POA: Diagnosis not present

## 2023-08-26 DIAGNOSIS — R918 Other nonspecific abnormal finding of lung field: Secondary | ICD-10-CM | POA: Diagnosis not present

## 2023-08-26 DIAGNOSIS — Z79899 Other long term (current) drug therapy: Secondary | ICD-10-CM | POA: Diagnosis not present

## 2023-08-26 DIAGNOSIS — J4520 Mild intermittent asthma, uncomplicated: Secondary | ICD-10-CM | POA: Diagnosis not present

## 2023-08-26 DIAGNOSIS — W19XXXA Unspecified fall, initial encounter: Secondary | ICD-10-CM | POA: Diagnosis present

## 2023-08-26 DIAGNOSIS — Z803 Family history of malignant neoplasm of breast: Secondary | ICD-10-CM | POA: Diagnosis not present

## 2023-08-26 DIAGNOSIS — S2231XA Fracture of one rib, right side, initial encounter for closed fracture: Secondary | ICD-10-CM

## 2023-08-26 DIAGNOSIS — Z8052 Family history of malignant neoplasm of bladder: Secondary | ICD-10-CM | POA: Diagnosis not present

## 2023-08-26 DIAGNOSIS — R410 Disorientation, unspecified: Secondary | ICD-10-CM | POA: Diagnosis not present

## 2023-08-26 DIAGNOSIS — J90 Pleural effusion, not elsewhere classified: Secondary | ICD-10-CM | POA: Diagnosis not present

## 2023-08-26 DIAGNOSIS — F419 Anxiety disorder, unspecified: Secondary | ICD-10-CM | POA: Diagnosis present

## 2023-08-26 DIAGNOSIS — E785 Hyperlipidemia, unspecified: Secondary | ICD-10-CM | POA: Diagnosis present

## 2023-08-26 DIAGNOSIS — E871 Hypo-osmolality and hyponatremia: Principal | ICD-10-CM

## 2023-08-26 DIAGNOSIS — M25462 Effusion, left knee: Secondary | ICD-10-CM | POA: Diagnosis not present

## 2023-08-26 DIAGNOSIS — I700 Atherosclerosis of aorta: Secondary | ICD-10-CM | POA: Diagnosis not present

## 2023-08-26 DIAGNOSIS — E875 Hyperkalemia: Secondary | ICD-10-CM | POA: Diagnosis present

## 2023-08-26 DIAGNOSIS — R296 Repeated falls: Secondary | ICD-10-CM | POA: Diagnosis present

## 2023-08-26 DIAGNOSIS — M4312 Spondylolisthesis, cervical region: Secondary | ICD-10-CM | POA: Diagnosis not present

## 2023-08-26 DIAGNOSIS — S2239XA Fracture of one rib, unspecified side, initial encounter for closed fracture: Secondary | ICD-10-CM | POA: Diagnosis present

## 2023-08-26 DIAGNOSIS — M4802 Spinal stenosis, cervical region: Secondary | ICD-10-CM | POA: Diagnosis not present

## 2023-08-26 LAB — CBC
HCT: 39.1 % (ref 36.0–46.0)
Hemoglobin: 14.2 g/dL (ref 12.0–15.0)
MCH: 30.7 pg (ref 26.0–34.0)
MCHC: 36.3 g/dL — ABNORMAL HIGH (ref 30.0–36.0)
MCV: 84.4 fL (ref 80.0–100.0)
Platelets: 238 10*3/uL (ref 150–400)
RBC: 4.63 MIL/uL (ref 3.87–5.11)
RDW: 11.9 % (ref 11.5–15.5)
WBC: 5 10*3/uL (ref 4.0–10.5)
nRBC: 0 % (ref 0.0–0.2)

## 2023-08-26 LAB — COMPREHENSIVE METABOLIC PANEL
ALT: 30 U/L (ref 0–44)
AST: 39 U/L (ref 15–41)
Albumin: 4.1 g/dL (ref 3.5–5.0)
Alkaline Phosphatase: 106 U/L (ref 38–126)
Anion gap: 12 (ref 5–15)
BUN: 12 mg/dL (ref 8–23)
CO2: 22 mmol/L (ref 22–32)
Calcium: 9.6 mg/dL (ref 8.9–10.3)
Chloride: 86 mmol/L — ABNORMAL LOW (ref 98–111)
Creatinine, Ser: 0.56 mg/dL (ref 0.44–1.00)
GFR, Estimated: >60 mL/min (ref >60)
Glucose, Bld: 106 mg/dL — ABNORMAL HIGH (ref 70–99)
Potassium: 4.3 mmol/L (ref 3.5–5.1)
Sodium: 120 mmol/L — ABNORMAL LOW (ref 135–145)
Total Bilirubin: 0.6 mg/dL (ref 0.0–1.2)
Total Protein: 6.7 g/dL (ref 6.5–8.1)

## 2023-08-26 LAB — CK: Total CK: 428 U/L — ABNORMAL HIGH (ref 38–234)

## 2023-08-26 LAB — OSMOLALITY: Osmolality: 251 mosm/kg — ABNORMAL LOW (ref 275–295)

## 2023-08-26 MED ORDER — SODIUM CHLORIDE 3% (HYPERTONIC SALINE) BOLUS VIA INFUSION
150.0000 mL | Freq: Once | INTRAVENOUS | Status: DC
Start: 2023-08-26 — End: 2023-08-26

## 2023-08-26 MED ORDER — LACTATED RINGERS IV BOLUS: 500.0000 mL | Freq: Once | INTRAVENOUS | Status: DC

## 2023-08-26 MED ORDER — SODIUM CHLORIDE 3 % IV SOLN
300.0000 mL/h | Freq: Once | INTRAVENOUS | Status: AC
  Administered 2023-08-26: 300 mL/h via INTRAVENOUS
  Filled 2023-08-26: qty 500

## 2023-08-26 MED ORDER — SODIUM CHLORIDE 3 % IV BOLUS: 150.0000 mL | Freq: Once | INTRAVENOUS | Status: DC

## 2023-08-26 NOTE — ED Triage Notes (Signed)
The pt has fallen x 3 yesterday today the daughter came to check on her and did not know that she had fallen last pm   but the daughter reports that the pt is confused today and she is hurting in lt knee and rt ribs bruise rt forehead  the pt is alert  and oriented  x 4

## 2023-08-26 NOTE — ED Provider Notes (Addendum)
  EMERGENCY DEPARTMENT AT [facility] Provider Note  Arrival date/time:08/26/2023 11:17 PM  HPI/ROS   Haley Fuller is a 83 y.o. female with PMH significant for GERD, varicose veins, depression anxiety, cognitive impairment who presents for fall  History is provided by patient and daughter.  Patient has been feeling foggy for the past few days.  Last night, she felt very off balance while trying to feed her horses and fell down 4 times.  She called her PCP about it today and was on her way to an appointment, however became lost while driving to her appointment. She called her daughter. Per her daughter, patient was lost while driving and she came to find her using her location.  Patient was having some difficulty fully expressing herself and seemed to be off her baseline.  She is also having very wobbly gait.  The only thing different recently is that she has recently increased her doses of gabapentin and Trileptal by her neurologist for her trigeminal neuralgia.  A complete ROS was performed with pertinent positives/negatives noted above.   ED Course and Medical Decision Making   I personally reviewed the patient's vitals.  Assessment/Plan: This is a 83 year old patient with history of trigeminal neuralgia on Trileptal and gabapentin is presenting for word finding difficulty and unsteady gait.  CT head and C-spine are reassuring with no traumatic injuries or brain bleeds. X-ray of the hips and knee do not show any acute fracture or malalignment. X-ray of the ribs do show minimally displaced right T8 rib fracture.  Her metabolic workup is concerning for hyponatremia to 120, which appears to be acute. She also has mild elevated CK to 428.  Given her recent increase in her Trileptal, I am concerned that this could possibly be a source of her hyponatremia.  Given patient's progressive onset of symptoms, I do have lower suspicion for stroke at this time and I am  concerned that her altered mental status and unsteady gait are in the setting of her metabolic derangements.  With her symptoms of word-finding difficulty and unsteady gait, will administer one time bolus of hypertonic saline, over .  I spoke with hospitalist Odie Sera, MD for admission. He will plan to admit patient to step down unit pending her bolus of hypertonic saline and repeat Na level.   Disposition: Admit  Clinical Impression:  1. Hyponatremia   2. Altered mental status, unspecified altered mental status type     Rx / DC Orders ED Discharge Orders     None       The plan for this patient was discussed with Dr. Elayne Snare, who voiced agreement and who oversaw evaluation and treatment of this patient.   Clinical Complexity A medically appropriate history, review of systems, and physical exam was performed.  Patient's presentation is most consistent with acute presentation with potential threat to life or bodily function.  Medical Decision Making Amount and/or Complexity of Data Reviewed Labs: ordered.  Risk Prescription drug management. Decision regarding hospitalization.    Physical Exam and Medical History   Vitals:   08/26/23 1857 08/26/23 1923  BP:  (!) 166/85  Pulse:  80  Resp:  18  Temp:  (!) 97.3 F (36.3 C)  TempSrc:  Oral  SpO2:  98%  Weight: 75.3 kg   Height: 5\' 6"  (1.676 m)     Physical Exam Vitals and nursing note reviewed.  Constitutional:      General: She is not in acute distress.  Appearance: She is well-developed.  HENT:     Head: Normocephalic and atraumatic.  Eyes:     Conjunctiva/sclera: Conjunctivae normal.  Cardiovascular:     Rate and Rhythm: Normal rate and regular rhythm.     Heart sounds: No murmur heard. Pulmonary:     Effort: Pulmonary effort is normal. No respiratory distress.     Breath sounds: Normal breath sounds.     Comments: Pain on palpation of lower anterior chest wall Abdominal:      Palpations: Abdomen is soft.     Tenderness: There is no abdominal tenderness.  Musculoskeletal:     Cervical back: Neck supple.     Comments: No obvious traumatic injuries on secondary survey  Skin:    General: Skin is warm and dry.     Capillary Refill: Capillary refill takes less than 2 seconds.  Neurological:     Mental Status: She is alert.     Comments: Word-finding difficulty Unsteady gait  Psychiatric:        Mood and Affect: Mood normal.     Medical History: No Known Allergies  Past Medical History:  Diagnosis Date   Asthma    Bilateral ankle fractures 2005   Depression    Hyperlipidemia    Hypertension    IFG (impaired fasting glucose)    Lichen planus    OA (osteoarthritis)    hands   Thrombophlebitis leg superficial    right leg   Trigeminal neuralgia    Varicose veins     Past Surgical History:  Procedure Laterality Date   ABDOMINAL HYSTERECTOMY     partial   COLONOSCOPY     Last in 2014   Family History  Problem Relation Age of Onset   Stroke Mother    Bladder Cancer Father    Breast cancer Sister 11 - 34   Cancer Brother    Cancer Brother    Cancer Brother    Cancer Brother     Social History   Tobacco Use   Smoking status: Never   Smokeless tobacco: Never  Vaping Use   Vaping status: Never Used  Substance Use Topics   Alcohol use: No    Alcohol/week: 0.0 standard drinks of alcohol   Drug use: No    Procedures   If procedures were preformed on this patient, they are listed below:  Procedures   -------- HPI and MDM generated using voice dictation software and may contain dictation errors. Please contact me for any clarification or with any questions.   Cephus Slater, MD Emergency Medicine PGY-2    Caron Presume, MD 08/26/23 2302    Caron Presume, MD 08/26/23 2312    Caron Presume, MD 08/26/23 2317    Royanne Foots, DO 08/28/23 1218

## 2023-08-26 NOTE — ED Triage Notes (Signed)
The pt took tylenol around 3 hours ago

## 2023-08-26 NOTE — ED Provider Triage Note (Signed)
Emergency Medicine Provider Triage Evaluation Note  Haley Fuller , a 83 y.o. female  was evaluated in triage.  Pt complains of fall that occurred last night.  Patient states that she had 4 falls last night and was on the ground for an unknown amount of time as she cannot stand up.  Patient states that her trigeminal neuralgia has been flaring up and she has been taking more of her gabapentin so thinks that this increase in the gabapentin made her drowsy and caused her to fall.  Patient has any chest pain shortness of breath but does note right rib pain along with left knee pain where the pain is behind her left knee.  Patient does have bruising to the right upper side of her head with a small cut and is not on any blood thinners but daughter is concerned about a brain bleed.  Review of Systems  Positive:  Negative:   Physical Exam  Ht 5\' 6"  (1.676 m)   Wt 75.3 kg   BMI 26.79 kg/m  Gen:   Awake, no distress   Resp:  Normal effort  MSK:   Moves extremities without difficulty  Other:  Small cut noted to upper right forehead with hematoma noted, slow word speech, no tenderness to right lower ribs or bony abnormalities, no abdominal tenderness or peritoneal signs, no tenderness when palpating left knee or left hip  Medical Decision Making  Medically screening exam initiated at 7:13 PM.  Appropriate orders placed.  Hubert Azure was informed that the remainder of the evaluation will be completed by another provider, this initial triage assessment does not replace that evaluation, and the importance of remaining in the ED until their evaluation is complete.  Workup initiated, patient stable at this time.   Netta Corrigan, [place] 08/26/23 [phone]

## 2023-08-27 ENCOUNTER — Inpatient Hospital Stay (HOSPITAL_COMMUNITY): Payer: Medicare PPO

## 2023-08-27 ENCOUNTER — Encounter (HOSPITAL_COMMUNITY): Payer: Self-pay | Admitting: Family Medicine

## 2023-08-27 DIAGNOSIS — Z803 Family history of malignant neoplasm of breast: Secondary | ICD-10-CM | POA: Diagnosis not present

## 2023-08-27 DIAGNOSIS — Z8052 Family history of malignant neoplasm of bladder: Secondary | ICD-10-CM | POA: Diagnosis not present

## 2023-08-27 DIAGNOSIS — S2231XA Fracture of one rib, right side, initial encounter for closed fracture: Secondary | ICD-10-CM | POA: Diagnosis present

## 2023-08-27 DIAGNOSIS — F419 Anxiety disorder, unspecified: Secondary | ICD-10-CM | POA: Diagnosis present

## 2023-08-27 DIAGNOSIS — W19XXXA Unspecified fall, initial encounter: Secondary | ICD-10-CM | POA: Diagnosis present

## 2023-08-27 DIAGNOSIS — Z809 Family history of malignant neoplasm, unspecified: Secondary | ICD-10-CM | POA: Diagnosis not present

## 2023-08-27 DIAGNOSIS — E871 Hypo-osmolality and hyponatremia: Principal | ICD-10-CM | POA: Diagnosis present

## 2023-08-27 DIAGNOSIS — E875 Hyperkalemia: Secondary | ICD-10-CM | POA: Diagnosis present

## 2023-08-27 DIAGNOSIS — Z8672 Personal history of thrombophlebitis: Secondary | ICD-10-CM | POA: Diagnosis not present

## 2023-08-27 DIAGNOSIS — R296 Repeated falls: Secondary | ICD-10-CM | POA: Diagnosis present

## 2023-08-27 DIAGNOSIS — G500 Trigeminal neuralgia: Secondary | ICD-10-CM | POA: Diagnosis present

## 2023-08-27 DIAGNOSIS — Z823 Family history of stroke: Secondary | ICD-10-CM | POA: Diagnosis not present

## 2023-08-27 DIAGNOSIS — J45909 Unspecified asthma, uncomplicated: Secondary | ICD-10-CM | POA: Diagnosis present

## 2023-08-27 DIAGNOSIS — F32A Depression, unspecified: Secondary | ICD-10-CM | POA: Diagnosis present

## 2023-08-27 DIAGNOSIS — I10 Essential (primary) hypertension: Secondary | ICD-10-CM | POA: Diagnosis present

## 2023-08-27 DIAGNOSIS — Z7951 Long term (current) use of inhaled steroids: Secondary | ICD-10-CM | POA: Diagnosis not present

## 2023-08-27 DIAGNOSIS — E785 Hyperlipidemia, unspecified: Secondary | ICD-10-CM | POA: Diagnosis present

## 2023-08-27 DIAGNOSIS — S2239XA Fracture of one rib, unspecified side, initial encounter for closed fracture: Secondary | ICD-10-CM | POA: Diagnosis present

## 2023-08-27 DIAGNOSIS — E222 Syndrome of inappropriate secretion of antidiuretic hormone: Secondary | ICD-10-CM | POA: Diagnosis present

## 2023-08-27 DIAGNOSIS — Z79899 Other long term (current) drug therapy: Secondary | ICD-10-CM | POA: Diagnosis not present

## 2023-08-27 LAB — BASIC METABOLIC PANEL
Anion gap: 13 (ref 5–15)
Anion gap: 12 (ref 5–15)
BUN: 10 mg/dL (ref 8–23)
BUN: 9 mg/dL (ref 8–23)
CO2: 20 mmol/L — ABNORMAL LOW (ref 22–32)
CO2: 19 mmol/L — ABNORMAL LOW (ref 22–32)
Calcium: 9.8 mg/dL (ref 8.9–10.3)
Calcium: 9.4 mg/dL (ref 8.9–10.3)
Chloride: 93 mmol/L — ABNORMAL LOW (ref 98–111)
Chloride: 92 mmol/L — ABNORMAL LOW (ref 98–111)
Creatinine, Ser: 0.5 mg/dL (ref 0.44–1.00)
Creatinine, Ser: 0.48 mg/dL (ref 0.44–1.00)
GFR, Estimated: >60 mL/min (ref >60)
GFR, Estimated: >60 mL/min (ref >60)
Glucose, Bld: 100 mg/dL — ABNORMAL HIGH (ref 70–99)
Glucose, Bld: 140 mg/dL — ABNORMAL HIGH (ref 70–99)
Potassium: 4.3 mmol/L (ref 3.5–5.1)
Potassium: 4.4 mmol/L (ref 3.5–5.1)
Sodium: 126 mmol/L — ABNORMAL LOW (ref 135–145)
Sodium: 123 mmol/L — ABNORMAL LOW (ref 135–145)

## 2023-08-27 LAB — URINALYSIS, ROUTINE W REFLEX MICROSCOPIC
Bacteria, UA: NONE SEEN
Bilirubin Urine: NEGATIVE
Glucose, UA: NEGATIVE mg/dL
Hgb urine dipstick: NEGATIVE
Ketones, ur: NEGATIVE mg/dL
Nitrite: NEGATIVE
Protein, ur: NEGATIVE mg/dL
Specific Gravity, Urine: 1.01 (ref 1.005–1.030)
pH: 6 (ref 5.0–8.0)

## 2023-08-27 LAB — CBC
HCT: 36.6 % (ref 36.0–46.0)
Hemoglobin: 13 g/dL (ref 12.0–15.0)
MCH: 30.8 pg (ref 26.0–34.0)
MCHC: 35.5 g/dL (ref 30.0–36.0)
MCV: 86.7 fL (ref 80.0–100.0)
Platelets: 214 10*3/uL (ref 150–400)
RBC: 4.22 MIL/uL (ref 3.87–5.11)
RDW: 11.9 % (ref 11.5–15.5)
WBC: 4 10*3/uL (ref 4.0–10.5)
nRBC: 0 % (ref 0.0–0.2)

## 2023-08-27 LAB — SODIUM: Sodium: 124 mmol/L — ABNORMAL LOW (ref 135–145)

## 2023-08-27 LAB — SODIUM, URINE, RANDOM: Sodium, Ur: 42 mmol/L

## 2023-08-27 LAB — TSH: TSH: 2.642 u[IU]/mL (ref 0.350–4.500)

## 2023-08-27 LAB — T4, FREE: Free T4: 0.55 ng/dL — ABNORMAL LOW (ref 0.61–1.12)

## 2023-08-27 LAB — CORTISOL-AM, BLOOD: Cortisol - AM: 8.2 ug/dL (ref 6.7–22.6)

## 2023-08-27 LAB — OSMOLALITY, URINE: Osmolality, Ur: 335 mosm/kg (ref 300–900)

## 2023-08-27 LAB — CBG MONITORING, ED: Glucose-Capillary: 99 mg/dL (ref 70–99)

## 2023-08-27 MED ORDER — ONDANSETRON HCL 4 MG PO TABS
4.0000 mg | ORAL_TABLET | Freq: Four times a day (QID) | ORAL | Status: DC | PRN
Start: 2023-08-27 — End: 2023-08-29

## 2023-08-27 MED ORDER — ENOXAPARIN SODIUM 40 MG/0.4ML IJ SOSY
40.0000 mg | PREFILLED_SYRINGE | Freq: Every day | INTRAMUSCULAR | Status: DC
  Administered 2023-08-27 – 2023-08-28 (×2): 40 mg via SUBCUTANEOUS
  Filled 2023-08-27 (×2): qty 0.4

## 2023-08-27 MED ORDER — ALBUTEROL SULFATE (2.5 MG/3ML) 0.083% IN NEBU: 2.5000 mg | INHALATION_SOLUTION | Freq: Four times a day (QID) | RESPIRATORY_TRACT | Status: DC | PRN

## 2023-08-27 MED ORDER — FENTANYL CITRATE PF 50 MCG/ML IJ SOSY: 12.5000 ug | PREFILLED_SYRINGE | INTRAMUSCULAR | Status: DC | PRN

## 2023-08-27 MED ORDER — ACETAMINOPHEN 500 MG PO TABS: 500.0000 mg | ORAL_TABLET | ORAL | Status: DC | PRN

## 2023-08-27 MED ORDER — GABAPENTIN 300 MG PO CAPS
300.0000 mg | ORAL_CAPSULE | Freq: Three times a day (TID) | ORAL | Status: DC
  Administered 2023-08-27 (×2): 300 mg via ORAL
  Filled 2023-08-27 (×2): qty 1

## 2023-08-27 MED ORDER — ONDANSETRON HCL 4 MG/2ML IJ SOLN: 4.0000 mg | Freq: Four times a day (QID) | INTRAMUSCULAR | Status: DC | PRN

## 2023-08-27 MED ORDER — ACETAMINOPHEN 650 MG RE SUPP: 650.0000 mg | Freq: Four times a day (QID) | RECTAL | Status: DC | PRN

## 2023-08-27 MED ORDER — ATORVASTATIN CALCIUM 10 MG PO TABS
20.0000 mg | ORAL_TABLET | Freq: Every day | ORAL | Status: DC
  Administered 2023-08-27 – 2023-08-28 (×2): 20 mg via ORAL
  Filled 2023-08-27 (×2): qty 2

## 2023-08-27 MED ORDER — SENNA 8.6 MG PO TABS
1.0000 | ORAL_TABLET | Freq: Every day | ORAL | Status: DC | PRN
Start: 2023-08-27 — End: 2023-08-29

## 2023-08-27 MED ORDER — SODIUM CHLORIDE 0.9% FLUSH
3.0000 mL | Freq: Two times a day (BID) | INTRAVENOUS | Status: DC
  Administered 2023-08-27 – 2023-08-29 (×6): 3 mL via INTRAVENOUS

## 2023-08-27 MED ORDER — IOHEXOL 350 MG/ML SOLN
75.0000 mL | Freq: Once | INTRAVENOUS | Status: AC | PRN
  Administered 2023-08-27: 75 mL via INTRAVENOUS

## 2023-08-27 MED ORDER — BUPROPION HCL ER (XL) 150 MG PO TB24
150.0000 mg | ORAL_TABLET | Freq: Every day | ORAL | Status: DC
  Administered 2023-08-27 – 2023-08-29 (×3): 150 mg via ORAL
  Filled 2023-08-27 (×3): qty 1

## 2023-08-27 MED ORDER — OXYCODONE HCL 5 MG PO TABS: 2.5000 mg | ORAL_TABLET | ORAL | Status: DC | PRN

## 2023-08-27 MED ORDER — ACETAMINOPHEN 325 MG PO TABS: 650.0000 mg | ORAL_TABLET | Freq: Four times a day (QID) | ORAL | Status: DC | PRN

## 2023-08-27 NOTE — ED Notes (Signed)
 Pt ambulated to restroom with assist x 1. Pt gait slow, benefit from rolling walker. Bedside commode placed at patient's bedside for next void.

## 2023-08-27 NOTE — H&P (Signed)
 History and Physical    Haley Fuller NWG:[id number] DOB: 17-Mar-1941 DOA: 08/26/2023  PCP: Cleatis Polka., MD   Patient coming from: Home   Chief Complaint: Falls, off balance, confused, right pleuritic chest pain   HPI: Haley Fuller is an 83 y.o. female with medical history significant for depression, anxiety, asthma, trigeminal neuralgia, and hyperlipidemia who presents with falls, pleuritic pain on the right, and confusion.  Patient reports feeling generally poor with fatigue and balance difficulty over the past few days.  She reports falling multiple times since the night of 08/25/2023.  She has been experiencing pain in the right chest wall with deep breath or movement.  She denies headache, focal numbness or weakness, nausea, vomiting, diarrhea, or loss of appetite.  She has not been drinking more than usual but she does feel that her chronic bilateral lower extremity swelling has increased recently.  Her daughter is concerned that the patient has been more confused and now has a wobbly gait.  ED Course: Upon arrival to the ED, patient is found to be afebrile and saturating well on room air with normal heart rate and stable blood pressure.  EKG demonstrates sinus rhythm.  Plain radiographs are notable for single right rib fracture, right middle lobe opacity, and no acute hip or knee findings.  Head CT is negative for acute findings.  Labs are most notable for sodium 120, normal creatinine, normal CBC, and serum osmolality of 251.  Patient was given 150 mL bolus of 3% NaCl in the ED with improvement in serum sodium to 124.  Urine osmolality and urine sodium levels have been ordered but not yet collected.  Review of Systems:  All other systems reviewed and apart from HPI, are negative.  Past Medical History:  Diagnosis Date   Asthma    Bilateral ankle fractures 2005   Depression    Hyperlipidemia    Hypertension    IFG (impaired fasting glucose)    Lichen planus    OA  (osteoarthritis)    hands   Thrombophlebitis leg superficial    right leg   Trigeminal neuralgia    Varicose veins     Past Surgical History:  Procedure Laterality Date   ABDOMINAL HYSTERECTOMY     partial   COLONOSCOPY     Last in 2014    Social History:   reports that she has never smoked. She has never used smokeless tobacco. She reports that she does not drink alcohol and does not use drugs.  No Known Allergies  Family History  Problem Relation Age of Onset   Stroke Mother    Bladder Cancer Father    Breast cancer Sister 57 - 19   Cancer Brother    Cancer Brother    Cancer Brother    Cancer Brother      Prior to Admission medications   Medication Sig Start Date End Date Taking? Authorizing Provider  acetaminophen (TYLENOL) 500 MG tablet Take 1,000 mg by mouth every 6 (six) hours as needed for mild pain (pain score 1-3) or moderate pain (pain score 4-6).   Yes [provider]  atorvastatin (LIPITOR) 20 MG tablet Take 20 mg by mouth at bedtime. 08/18/21  Yes [provider]  buPROPion (WELLBUTRIN XL) 150 MG 24 hr tablet Take 150 mg by mouth daily after breakfast.   Yes [provider]  calcium carbonate (TUMS - DOSED IN MG ELEMENTAL CALCIUM) 500 MG chewable tablet Chew 2 tablets by mouth daily as  needed for indigestion or heartburn.   Yes [provider]  gabapentin (NEURONTIN) 300 MG capsule Take 1 capsule (300 mg total) by mouth 4 (four) times daily. 08/22/23  Yes Levert Feinstein, MD  OXcarbazepine (TRILEPTAL) 150 MG tablet Take 2 tablets (300 mg total) by mouth 3 (three) times daily. 08/22/23  Yes Levert Feinstein, MD  venlafaxine (EFFEXOR) 75 MG tablet Take 75 mg by mouth 2 (two) times daily.   Yes [provider]  venlafaxine XR (EFFEXOR-XR) 75 MG 24 hr capsule Take 75 mg by mouth daily with breakfast.   Yes [provider]  albuterol (VENTOLIN HFA) 108 (90 Base) MCG/ACT inhaler Inhale 2 puffs into the lungs every 6 (six) hours  as needed for wheezing or shortness of breath. Patient not taking: Reported on 08/26/2023    [provider]  budesonide-formoterol (SYMBICORT) 160-4.5 MCG/ACT inhaler Inhale 2 puffs into the lungs 2 (two) times daily as needed.  Patient not taking: Reported on 08/26/2023    [provider]  losartan (COZAAR) 50 MG tablet Take 50 mg by mouth 2 (two) times daily. Patient not taking: Reported on 08/26/2023    [provider]    Physical Exam: Vitals:   08/26/23 1857 08/26/23 1923 08/26/23 2324  BP:  (!) 166/85 (!) 147/62  Pulse:  80 74  Resp:  18 20  Temp:  (!) 97.3 F (36.3 C) 98 F (36.7 C)  TempSrc:  Oral   SpO2:  98% 97%  Weight: 75.3 kg    Height: 5\' 6"  (1.676 m)      Constitutional: NAD, no diaphoresis   Eyes: PERTLA, lids and conjunctivae normal ENMT: Mucous membranes are moist. Posterior pharynx clear of any exudate or lesions.   Neck: supple, no masses  Respiratory: no wheezing, no crackles. No accessory muscle use.  Cardiovascular: S1 & S2 heard, regular rate and rhythm. Bilateral lower extremity edema.  Abdomen: No distension, no tenderness, soft. Bowel sounds active.  Musculoskeletal: no clubbing / cyanosis. No joint deformity upper and lower extremities.   Skin: no significant rashes, lesions, ulcers. Warm, dry, well-perfused. Neurologic: CN 2-12 grossly intact. Sensation to light touch intact. Strength 5/5 in all 4 limbs. Alert and oriented to person, place, and situation.  Psychiatric: Calm. Cooperative.    Labs and Imaging on Admission: I have personally reviewed following labs and imaging studies  CBC: Recent Labs  Lab 08/26/23 1915  WBC 5.0  HGB 14.2  HCT 39.1  MCV 84.4  PLT 238   Basic Metabolic Panel: Recent Labs  Lab 08/26/23 1915 08/27/23 0110  NA 120* 124*  K 4.3  --   CL 86*  --   CO2 22  --   GLUCOSE 106*  --   BUN 12  --   CREATININE 0.56  --   CALCIUM 9.6  --    GFR: Estimated Creatinine Clearance: 58.2  mL/min (by C-G formula based on SCr of 0.56 mg/dL). Liver Function Tests: Recent Labs  Lab 08/26/23 1915  AST 39  ALT 30  ALKPHOS 106  BILITOT 0.6  PROT 6.7  ALBUMIN 4.1   No results for input(s): "LIPASE", "AMYLASE" in the last 168 hours. No results for input(s): "AMMONIA" in the last 168 hours. Coagulation Profile: No results for input(s): "INR", "PROTIME" in the last 168 hours. Cardiac Enzymes: Recent Labs  Lab 08/26/23 1915  CKTOTAL 428*   BNP (last 3 results) No results for input(s): "PROBNP" in the last 8760 hours. HbA1C: No results for input(s): "  HGBA1C" in the last 72 hours. CBG: No results for input(s): "GLUCAP" in the last 168 hours. Lipid Profile: No results for input(s): "CHOL", "HDL", "LDLCALC", "TRIG", "CHOLHDL", "LDLDIRECT" in the last 72 hours. Thyroid Function Tests: No results for input(s): "TSH", "T4TOTAL", "FREET4", "T3FREE", "THYROIDAB" in the last 72 hours. Anemia Panel: No results for input(s): "VITAMINB12", "FOLATE", "FERRITIN", "TIBC", "IRON", "RETICCTPCT" in the last 72 hours. Urine analysis: No results found for: "COLORURINE", "APPEARANCEUR", "LABSPEC", "PHURINE", "GLUCOSEU", "HGBUR", "BILIRUBINUR", "KETONESUR", "PROTEINUR", "UROBILINOGEN", "NITRITE", "LEUKOCYTESUR" Sepsis Labs: @LABRCNTIP (procalcitonin:4,lacticidven:4) )No results found for this or any previous visit (from the past 240 hours).   Radiological Exams on Admission: DG Ribs Unilateral W/Chest Right Result Date: 08/26/2023 CLINICAL DATA:  Fall with right rib and left knee pain. EXAM: RIGHT RIBS AND CHEST - 3+ VIEW; LEFT KNEE - COMPLETE 4+ VIEW; DG HIP (WITH OR WITHOUT PELVIS) 2-3V LEFT COMPARISON:  10/15/2021. FINDINGS: Left hip with pelvis: No acute fracture or dislocation is seen. Mild-to-moderate degenerative changes are present in the hips bilaterally. Degenerative changes are present in the lower lumbar spine. Right ribs: There is a minimally displaced fracture of the T8 rib on the  right. A irregular parenchymal opacity is noted in the mid right lung, which may correspond to previously described nodules. There is blunting of the right costophrenic angle. No pneumothorax is seen. Heart size and mediastinal contours are within normal limits. There is atherosclerotic calcification of the aorta. Left knee: No acute fracture or dislocation is seen. Mild-to-moderate degenerative changes are noted in the medial and patellofemoral compartments. There is a trace suprapatellar joint effusion. The soft tissues are within normal limits. IMPRESSION: 1. Minimally displaced fracture of the T8 rib on the right. 2. Blunting of the right costophrenic angle, possible atelectasis and/or small pleural effusion. 3. Parenchymal opacity in the mid right lung, possibly corresponding to nodules seen on CT chest from 2023. Consider nonemergent CT chest for follow-up. 4. No acute fracture at the left hip or knee. Electronically Signed   By: Thornell Sartorius M.D.   On: 08/26/2023 20:39   DG Knee Complete 4 Views Left Result Date: 08/26/2023 CLINICAL DATA:  Fall with right rib and left knee pain. EXAM: RIGHT RIBS AND CHEST - 3+ VIEW; LEFT KNEE - COMPLETE 4+ VIEW; DG HIP (WITH OR WITHOUT PELVIS) 2-3V LEFT COMPARISON:  10/15/2021. FINDINGS: Left hip with pelvis: No acute fracture or dislocation is seen. Mild-to-moderate degenerative changes are present in the hips bilaterally. Degenerative changes are present in the lower lumbar spine. Right ribs: There is a minimally displaced fracture of the T8 rib on the right. A irregular parenchymal opacity is noted in the mid right lung, which may correspond to previously described nodules. There is blunting of the right costophrenic angle. No pneumothorax is seen. Heart size and mediastinal contours are within normal limits. There is atherosclerotic calcification of the aorta. Left knee: No acute fracture or dislocation is seen. Mild-to-moderate degenerative changes are noted in the  medial and patellofemoral compartments. There is a trace suprapatellar joint effusion. The soft tissues are within normal limits. IMPRESSION: 1. Minimally displaced fracture of the T8 rib on the right. 2. Blunting of the right costophrenic angle, possible atelectasis and/or small pleural effusion. 3. Parenchymal opacity in the mid right lung, possibly corresponding to nodules seen on CT chest from 2023. Consider nonemergent CT chest for follow-up. 4. No acute fracture at the left hip or knee. Electronically Signed   By: Thornell Sartorius M.D.   On: 08/26/2023 20:39  DG Hip Unilat With Pelvis 2-3 Views Left Result Date: 08/26/2023 CLINICAL DATA:  Fall with right rib and left knee pain. EXAM: RIGHT RIBS AND CHEST - 3+ VIEW; LEFT KNEE - COMPLETE 4+ VIEW; DG HIP (WITH OR WITHOUT PELVIS) 2-3V LEFT COMPARISON:  10/15/2021. FINDINGS: Left hip with pelvis: No acute fracture or dislocation is seen. Mild-to-moderate degenerative changes are present in the hips bilaterally. Degenerative changes are present in the lower lumbar spine. Right ribs: There is a minimally displaced fracture of the T8 rib on the right. A irregular parenchymal opacity is noted in the mid right lung, which may correspond to previously described nodules. There is blunting of the right costophrenic angle. No pneumothorax is seen. Heart size and mediastinal contours are within normal limits. There is atherosclerotic calcification of the aorta. Left knee: No acute fracture or dislocation is seen. Mild-to-moderate degenerative changes are noted in the medial and patellofemoral compartments. There is a trace suprapatellar joint effusion. The soft tissues are within normal limits. IMPRESSION: 1. Minimally displaced fracture of the T8 rib on the right. 2. Blunting of the right costophrenic angle, possible atelectasis and/or small pleural effusion. 3. Parenchymal opacity in the mid right lung, possibly corresponding to nodules seen on CT chest from 2023.  Consider nonemergent CT chest for follow-up. 4. No acute fracture at the left hip or knee. Electronically Signed   By: Thornell Sartorius M.D.   On: 08/26/2023 20:39   CT Cervical Spine Wo Contrast Result Date: 08/26/2023 CLINICAL DATA:  Multiple falls yesterday. Increased confusion today. EXAM: CT CERVICAL SPINE WITHOUT CONTRAST TECHNIQUE: Multidetector CT imaging of the cervical spine was performed without intravenous contrast. Multiplanar CT image reconstructions were also generated. RADIATION DOSE REDUCTION: This exam was performed according to the departmental dose-optimization program which includes automated exposure control, adjustment of the mA and/or kV according to patient size and/or use of iterative reconstruction technique. COMPARISON:  None Available. FINDINGS: Alignment: 3 mm grade 1 anterolisthesis is present at C4-5. No other significant listhesis is present. Mild straightening of the normal cervical lordosis is present. Skull base and vertebrae: The craniocervical junction is within normal limits. Mild pannus formation is noted about the dens. Vertebral body heights are maintained. No acute healing fracture is present. Soft tissues and spinal canal: No prevertebral fluid or swelling. No visible canal hematoma. Disc levels: Asymmetric right-sided facet and uncovertebral spurring lead to moderate to severe right foraminal stenosis at C2-3 C3-4 and C4-5. No more broad-based disc osteophyte complex and bilateral foraminal stenosis is present at C5-6 and C6-7. Upper chest: The lung apices are clear. The thoracic inlet is within normal limits. IMPRESSION: 1. No acute or healing fracture. 2. 3 mm grade 1 anterolisthesis at C4-5. 3. Asymmetric right-sided facet and uncovertebral spurring lead to moderate to severe right foraminal stenosis at C2-3 C3-4 and C4-5. 4. More broad-based disc osteophyte complex and bilateral foraminal stenosis at C5-6 and C6-7. Electronically Signed   By: Marin Roberts M.D.    On: 08/26/2023 20:17   CT Head Wo Contrast Result Date: 08/26/2023 CLINICAL DATA:  Multiple falls yesterday.  Increased confusion. EXAM: CT HEAD WITHOUT CONTRAST TECHNIQUE: Contiguous axial images were obtained from the base of the skull through the vertex without intravenous contrast. RADIATION DOSE REDUCTION: This exam was performed according to the departmental dose-optimization program which includes automated exposure control, adjustment of the mA and/or kV according to patient size and/or use of iterative reconstruction technique. COMPARISON:  MRI of the orbits without and with contrast 03/02/23  FINDINGS: Brain: Mild atrophy and white matter changes are present bilaterally. No acute infarct, hemorrhage, or mass lesion is present. Deep brain nuclei are within normal limits. The ventricles are of normal size. No significant extraaxial fluid collection is present. The brainstem and cerebellum are within normal limits. Midline structures are within normal limits. Vascular: No hyperdense vessel or unexpected calcification. Skull: Normal. Negative for fracture or focal lesion. Sinuses/Orbits: The paranasal sinuses and mastoid air cells are clear. Bilateral lens replacements are noted. Globes and orbits are otherwise unremarkable. IMPRESSION: 1. No acute intracranial abnormality or significant interval change. 2. Mild atrophy and white matter disease likely reflects the sequela of chronic microvascular ischemia. Electronically Signed   By: Marin Roberts M.D.   On: 08/26/2023 20:09    EKG: Independently reviewed. Sinus rhythm.   Assessment/Plan   1. Hypotonic hyponatremia  - Improved from 120 to 124 with bolus of 3% NaCl in ED  - Check urine sodium and urine osm, hold Trileptal and Effexor, restrict free-water intake, continue serial sodium measurements    2. Rib fracture  - Pain-control and incentive spirometry   3. Trigeminal neuralgia  - Hold Trileptal for now while investigating etiology  of hyponatremia, continue gabapentin    4. Asthma  - Not in exacerbation  - Albuterol as needed    5. Depression, anxiety  - Hold Effexor while investigating etiology of hyponatremia, continue Wellbutrin   6. Lung nodules  - Outpatient follow-up advised    DVT prophylaxis: Lovenox  Code Status: Full  Level of Care: Level of care: Progressive Family Communication: Daughter updated from ED  Disposition Plan:  Patient is from: Home  Anticipated d/c is to: TBD Anticipated d/c date is: 08/30/23  Patient currently: Pending improved sodium level, pain-control  Consults called: None  Admission status: Inpatient     Briscoe Deutscher, MD [facility]  08/27/2023, 2:50 AM

## 2023-08-27 NOTE — Evaluation (Signed)
 Physical Therapy Evaluation Patient Details Name: Haley Fuller MRN: [id number] DOB: [birth year] Today's Date: 08/27/2023  History of Present Illness  Pt is 83 yo presenting 2/21 to [facility] ED due to falls, pleuritic pain on the R and confusion. Found R rib fracture. PMH: depression, anxiety, asthma, trigeminal neuralgia and hyperlipidemia.  Clinical Impression  Pt is presenting close to baseline level of function. Pt is supervision for bed mobility, sit to stand with RW and CGA for gait. Previously pt was mod I with SPC. Pt is at a moderate risk for falls. Due to pt current functional status, home set up and available assistance at home recommending skilled physical therapy services 3x/week in order to address strength, balance and functional mobility to decrease risk for falls, injury and re-hospitalization.           If plan is discharge home, recommend the following: Help with stairs or ramp for entrance;Assist for transportation;Assistance with cooking/housework     Equipment Recommendations Rolling walker (2 wheels);BSC/3in1;Other (comment) (3 and 1 or shower chair.)     Functional Status Assessment Patient has had a recent decline in their functional status and demonstrates the ability to make significant improvements in function in a reasonable and predictable amount of time.     Precautions / Restrictions Precautions Precautions: Fall Restrictions Weight Bearing Restrictions Per Provider Order: No      Mobility  Bed Mobility Overal bed mobility: Needs Assistance Bed Mobility: Supine to Sit, Sit to Supine     Supine to sit: Supervision Sit to supine: Supervision   General bed mobility comments: supervision for safety. Uses momentum to get LE up onto bed and off of bed.    Transfers Overall transfer level: Needs assistance Equipment used: Rolling walker (2 wheels) Transfers: Sit to/from Stand Sit to Stand: Supervision           General transfer comment: good hand  placement. Supervision for balance.    Ambulation/Gait Ambulation/Gait assistance: Contact guard assist Gait Distance (Feet): 100 Feet (2x) Assistive device: Rolling walker (2 wheels) Gait Pattern/deviations: Step-through pattern, Decreased stride length Gait velocity: decreased Gait velocity interpretation: 1.31 - 2.62 ft/sec, indicative of limited community ambulator   General Gait Details: cues for proximity to RW and upright posture.      Balance Overall balance assessment: Mild deficits observed, not formally tested, Needs assistance Sitting-balance support: Feet unsupported, Single extremity supported Sitting balance-Leahy Scale: Good     Standing balance support: Bilateral upper extremity supported, Reliant on assistive device for balance, During functional activity Standing balance-Leahy Scale: Fair Standing balance comment: with AD balance is fair         Pertinent Vitals/Pain Pain Assessment Pain Assessment: No/denies pain    Home Living Family/patient expects to be discharged to:: Private residence Living Arrangements: Alone (sister who is physically disabled using RW lives on property but could only call for assistance.) Available Help at Discharge: Family;Available PRN/intermittently Type of Home: House Home Access: Stairs to enter Entrance Stairs-Rails: Doctor, general practice of Steps: 5-6 Alternate Level Stairs-Number of Steps: 12-13 Home Layout: Two level Home Equipment: Cane - single point;Grab bars - toilet      Prior Function Prior Level of Function : Independent/Modified Independent             Mobility Comments: ambulates with SPC normally ADLs Comments: Mod I with ADL's.     Extremity/Trunk Assessment   Upper Extremity Assessment Upper Extremity Assessment: Defer to OT evaluation    Lower Extremity Assessment Lower Extremity  Assessment: Overall WFL for tasks assessed    Cervical / Trunk Assessment Cervical / Trunk  Assessment: Kyphotic  Communication   Communication Communication: No apparent difficulties    Cognition Arousal: Alert Behavior During Therapy: WFL for tasks assessed/performed   PT - Cognitive impairments: No apparent impairments               General Comments General comments (skin integrity, edema, etc.): Pt has a small bruise on her forehead and R buttocks. Daughter present throughout session. Pt O2 sats and HR WNL throughout session.        Assessment/Plan    PT Assessment Patient needs continued PT services  PT Problem List Decreased balance;Decreased mobility       PT Treatment Interventions DME instruction;Therapeutic activities;Gait training;Therapeutic exercise;Patient/family education;Stair training;Balance training;Functional mobility training    PT Goals (Current goals can be found in the Care Plan section)  Acute Rehab PT Goals Patient Stated Goal: to return home. PT Goal Formulation: With patient/family Time For Goal Achievement: 09/10/23 Potential to Achieve Goals: Good    Frequency Min 1X/week        AM-PAC PT "6 Clicks" Mobility  Outcome Measure Help needed turning from your back to your side while in a flat bed without using bedrails?: A Little Help needed moving from lying on your back to sitting on the side of a flat bed without using bedrails?: A Little Help needed moving to and from a bed to a chair (including a wheelchair)?: A Little Help needed standing up from a chair using your arms (e.g., wheelchair or bedside chair)?: A Little Help needed to walk in hospital room?: A Little Help needed climbing 3-5 steps with a railing? : A Little 6 Click Score: 18    End of Session Equipment Utilized During Treatment: Gait belt Activity Tolerance: Patient tolerated treatment well Patient left: in bed;with call bell/phone within reach;with family/visitor present Nurse Communication: Mobility status PT Visit Diagnosis: Other abnormalities of gait  and mobility (R26.89);Unsteadiness on feet (R26.81)    Time: 1610-9604 PT Time Calculation (min) (ACUTE ONLY): 27 min   Charges:   PT Evaluation $PT Eval Low Complexity: 1 Low PT Treatments $Therapeutic Activity: 8-22 mins PT General Charges $$ ACUTE PT VISIT: 1 Visit        Harrel Carina, DPT, CLT  Acute Rehabilitation Services Office: [phone] (Secure chat preferred)   Claudia Desanctis 08/27/2023, 4:54 PM

## 2023-08-27 NOTE — Progress Notes (Addendum)
 PROGRESS NOTE    Haley Fuller  ZOX:[id number] DOB: [birth year] DOA: 08/26/2023 PCP: Cleatis Polka., MD     Brief Narrative:  Haley Fuller is an 83 y.o. female with medical history significant for depression, anxiety, asthma, trigeminal neuralgia, and hyperlipidemia who presents with falls, pleuritic pain on the right, and confusion.    Patient reports feeling generally poor with fatigue and balance difficulty over the past few days.  She reports falling multiple times since the night of 08/25/2023.  She has been experiencing pain in the right chest wall with deep breath or movement.  She denies headache, focal numbness or weakness, nausea, vomiting, diarrhea, or loss of appetite.  She has not been drinking more than usual but she does feel that her chronic bilateral lower extremity swelling has increased recently.   Workup revealed hyponatremia with sodium 120.  CT head was negative.  Also found to have right-sided rib fracture.  New events last 24 hours / Subjective: Patient seen in the emergency department with family at bedside.  Patient reports that she is feeling better, was able to get some rest.  She lives at home alone, is independent of ADLs  Assessment & Plan:   Principal Problem:   Hyponatremia Active Problems:   Right trigeminal neuralgia   Depression   Anxiety   Asthma   Rib fracture   Hypoosmolar hyponatremia, SIADH -Serum osmole 251 -Urine osmole 335 -Urine sodium 42 -TSH within normal limit, cortisol normal -Most likely culprit is Trileptal (started by Dr. Terrace Arabia for trigeminal neuralgia). She is also on Effexor, which is a chronic medication, which is possible contributor -She also does have history of small pulmonary nodules on CT scan from April 2023.  Repeat CT to rule out changes -Fluid restriction diet, low-sodium diet -Continue to trend BMP every 6 hours -Hyponatremia has been improving  Recurrent falls -Likely in setting of hyponatremia -PT  OT  Right-sided rib fracture -Supportive care, incentive spirometer, pain control  History of trigeminal neuralgia -Status post gamma knife radiosurgery at [facility] -Holding Trileptal due to SIADH -Holding gabapentin due to patient report of fatigue, mental fogginess since starting this med  Depression/anxiety -Holding Effexor due to SIADH -Continue Wellbutrin  Hyperlipidemia -Lipitor   DVT prophylaxis:  enoxaparin (LOVENOX) injection 40 mg Start: 08/27/23 1000  Code Status: Full code Family Communication: Son and granddaughter at bedside, another son over the phone Disposition Plan: Home Status is: Inpatient Remains inpatient appropriate because: Hyponatremia    Antimicrobials:  Anti-infectives (From admission, onward)    None        Objective: Vitals:   08/27/23 0921 08/27/23 1000 08/27/23 1030 08/27/23 1156  BP:  121/62 130/78 121/70  Pulse:  74 72 74  Resp:  17 16 16   Temp: 97.8 F (36.6 C)     TempSrc: Oral     SpO2:  97% 95% 98%  Weight:      Height:        Intake/Output Summary (Last 24 hours) at 08/27/2023 1244 Last data filed at 08/27/2023 1154 Gross per 24 hour  Intake 465.47 ml  Output 800 ml  Net -334.53 ml   Filed Weights   08/26/23 1857  Weight: 75.3 kg    Examination:  General exam: Appears calm and comfortable  Respiratory system: Respiratory effort normal. No respiratory distress. No conversational dyspnea.  Cardiovascular system: NSR on tele  Central nervous system: Alert and oriented. No focal neurological deficits. Speech clear.  Extremities: Symmetric  in appearance  Skin: No rashes, lesions or ulcers on exposed skin  Psychiatry: Judgement and insight appear normal. Mood & affect appropriate.   Data Reviewed: I have personally reviewed following labs and imaging studies  CBC: Recent Labs  Lab 08/26/23 1915 08/27/23 0527  WBC 5.0 4.0  HGB 14.2 13.0  HCT 39.1 36.6  MCV 84.4 86.7  PLT 238 214   Basic  Metabolic Panel: Recent Labs  Lab 08/26/23 1915 08/27/23 0110 08/27/23 0338 08/27/23 0857  NA 120* 124* 126* 123*  K 4.3  --  4.3 4.4  CL 86*  --  93* 92*  CO2 22  --  20* 19*  GLUCOSE 106*  --  100* 140*  BUN 12  --  10 9  CREATININE 0.56  --  0.50 0.48  CALCIUM 9.6  --  9.8 9.4   GFR: Estimated Creatinine Clearance: 58.2 mL/min (by C-G formula based on SCr of 0.48 mg/dL). Liver Function Tests: Recent Labs  Lab 08/26/23 1915  AST 39  ALT 30  ALKPHOS 106  BILITOT 0.6  PROT 6.7  ALBUMIN 4.1   No results for input(s): "LIPASE", "AMYLASE" in the last 168 hours. No results for input(s): "AMMONIA" in the last 168 hours. Coagulation Profile: No results for input(s): "INR", "PROTIME" in the last 168 hours. Cardiac Enzymes: Recent Labs  Lab 08/26/23 1915  CKTOTAL 428*   BNP (last 3 results) No results for input(s): "PROBNP" in the last 8760 hours. HbA1C: No results for input(s): "HGBA1C" in the last 72 hours. CBG: No results for input(s): "GLUCAP" in the last 168 hours. Lipid Profile: No results for input(s): "CHOL", "HDL", "LDLCALC", "TRIG", "CHOLHDL", "LDLDIRECT" in the last 72 hours. Thyroid Function Tests: Recent Labs    08/27/23 0857  TSH 2.642  FREET4 0.55*   Anemia Panel: No results for input(s): "VITAMINB12", "FOLATE", "FERRITIN", "TIBC", "IRON", "RETICCTPCT" in the last 72 hours. Sepsis Labs: No results for input(s): "PROCALCITON", "LATICACIDVEN" in the last 168 hours.  No results found for this or any previous visit (from the past 240 hours).    Radiology Studies: CT CHEST W CONTRAST Result Date: 08/27/2023 CLINICAL DATA:  Recurrent falls with new diagnosis of inappropriate antidiuretic hormone secretion (SIADH) EXAM: CT CHEST WITH CONTRAST TECHNIQUE: Multidetector CT imaging of the chest was performed during intravenous contrast administration. RADIATION DOSE REDUCTION: This exam was performed according to the departmental dose-optimization program  which includes automated exposure control, adjustment of the mA and/or kV according to patient size and/or use of iterative reconstruction technique. CONTRAST:  75mL OMNIPAQUE IOHEXOL 350 MG/ML SOLN COMPARISON:  Right rib radiographs dated 08/26/2023, CTA chest dated 10/15/2021 FINDINGS: Cardiovascular: Normal heart size. No significant pericardial fluid/thickening. Great vessels are normal in course and caliber. No central pulmonary emboli. Coronary artery calcifications and aortic atherosclerosis. Mediastinum/Nodes: Imaged thyroid gland without nodules meeting criteria for imaging follow-up by size. Normal esophagus. No pathologically enlarged axillary, supraclavicular, mediastinal, or hilar lymph nodes. Lungs/Pleura: The central airways are patent. Similar mild diffuse bronchiectasis, most notable in the lingula. Multifocal subsegmental mucous plugging. Irregular cystic lesion in the peripheral right upper lobe measures 2.7 x 1.7 cm, unchanged with slightly increased mural thickening/surrounding ground-glass opacities. A few ground-glass/tree-in-bud nodules, for example medial lingula, are new. The remainder of multifocal bilateral scattered ground-glass and tree-in-bud nodules are grossly similar compared to 10/15/2021. Right lower lobe relaxation atelectasis. No pneumothorax. Trace right pleural effusion. Upper abdomen: Normal. Musculoskeletal: No acute or abnormal lytic or blastic osseous lesions. Multilevel degenerative changes of  the thoracic spine. IMPRESSION: 1. Irregular cystic lesion in the peripheral right upper lobe measures 2.7 x 1.7 cm, unchanged with slightly increased mural thickening/surrounding ground-glass opacities which may reflect superimposed infection/inflammation. 2. A few new ground-glass/tree-in-bud nodules on a background of bronchiectasis and similar chronic multifocal bilateral scattered ground-glass and tree-in-bud nodules, favored to reflect a chronic, indolent  infectious/inflammatory process, including atypical etiologies such as nontuberculous mycobacterial infection. 3. Trace right pleural effusion. 4. Aortic Atherosclerosis (ICD10-I70.0). Coronary artery calcifications. Assessment for potential risk factor modification, dietary therapy or pharmacologic therapy may be warranted, if clinically indicated. Electronically Signed   By: Agustin Cree M.D.   On: 08/27/2023 12:30   DG Ribs Unilateral W/Chest Right Result Date: 08/26/2023 CLINICAL DATA:  Fall with right rib and left knee pain. EXAM: RIGHT RIBS AND CHEST - 3+ VIEW; LEFT KNEE - COMPLETE 4+ VIEW; DG HIP (WITH OR WITHOUT PELVIS) 2-3V LEFT COMPARISON:  10/15/2021. FINDINGS: Left hip with pelvis: No acute fracture or dislocation is seen. Mild-to-moderate degenerative changes are present in the hips bilaterally. Degenerative changes are present in the lower lumbar spine. Right ribs: There is a minimally displaced fracture of the T8 rib on the right. A irregular parenchymal opacity is noted in the mid right lung, which may correspond to previously described nodules. There is blunting of the right costophrenic angle. No pneumothorax is seen. Heart size and mediastinal contours are within normal limits. There is atherosclerotic calcification of the aorta. Left knee: No acute fracture or dislocation is seen. Mild-to-moderate degenerative changes are noted in the medial and patellofemoral compartments. There is a trace suprapatellar joint effusion. The soft tissues are within normal limits. IMPRESSION: 1. Minimally displaced fracture of the T8 rib on the right. 2. Blunting of the right costophrenic angle, possible atelectasis and/or small pleural effusion. 3. Parenchymal opacity in the mid right lung, possibly corresponding to nodules seen on CT chest from 2023. Consider nonemergent CT chest for follow-up. 4. No acute fracture at the left hip or knee. Electronically Signed   By: Thornell Sartorius M.D.   On: 08/26/2023 20:39    DG Knee Complete 4 Views Left Result Date: 08/26/2023 CLINICAL DATA:  Fall with right rib and left knee pain. EXAM: RIGHT RIBS AND CHEST - 3+ VIEW; LEFT KNEE - COMPLETE 4+ VIEW; DG HIP (WITH OR WITHOUT PELVIS) 2-3V LEFT COMPARISON:  10/15/2021. FINDINGS: Left hip with pelvis: No acute fracture or dislocation is seen. Mild-to-moderate degenerative changes are present in the hips bilaterally. Degenerative changes are present in the lower lumbar spine. Right ribs: There is a minimally displaced fracture of the T8 rib on the right. A irregular parenchymal opacity is noted in the mid right lung, which may correspond to previously described nodules. There is blunting of the right costophrenic angle. No pneumothorax is seen. Heart size and mediastinal contours are within normal limits. There is atherosclerotic calcification of the aorta. Left knee: No acute fracture or dislocation is seen. Mild-to-moderate degenerative changes are noted in the medial and patellofemoral compartments. There is a trace suprapatellar joint effusion. The soft tissues are within normal limits. IMPRESSION: 1. Minimally displaced fracture of the T8 rib on the right. 2. Blunting of the right costophrenic angle, possible atelectasis and/or small pleural effusion. 3. Parenchymal opacity in the mid right lung, possibly corresponding to nodules seen on CT chest from 2023. Consider nonemergent CT chest for follow-up. 4. No acute fracture at the left hip or knee. Electronically Signed   By: Charlestine Night.D.  On: 08/26/2023 20:39   DG Hip Unilat With Pelvis 2-3 Views Left Result Date: 08/26/2023 CLINICAL DATA:  Fall with right rib and left knee pain. EXAM: RIGHT RIBS AND CHEST - 3+ VIEW; LEFT KNEE - COMPLETE 4+ VIEW; DG HIP (WITH OR WITHOUT PELVIS) 2-3V LEFT COMPARISON:  10/15/2021. FINDINGS: Left hip with pelvis: No acute fracture or dislocation is seen. Mild-to-moderate degenerative changes are present in the hips bilaterally. Degenerative  changes are present in the lower lumbar spine. Right ribs: There is a minimally displaced fracture of the T8 rib on the right. A irregular parenchymal opacity is noted in the mid right lung, which may correspond to previously described nodules. There is blunting of the right costophrenic angle. No pneumothorax is seen. Heart size and mediastinal contours are within normal limits. There is atherosclerotic calcification of the aorta. Left knee: No acute fracture or dislocation is seen. Mild-to-moderate degenerative changes are noted in the medial and patellofemoral compartments. There is a trace suprapatellar joint effusion. The soft tissues are within normal limits. IMPRESSION: 1. Minimally displaced fracture of the T8 rib on the right. 2. Blunting of the right costophrenic angle, possible atelectasis and/or small pleural effusion. 3. Parenchymal opacity in the mid right lung, possibly corresponding to nodules seen on CT chest from 2023. Consider nonemergent CT chest for follow-up. 4. No acute fracture at the left hip or knee. Electronically Signed   By: Thornell Sartorius M.D.   On: 08/26/2023 20:39   CT Cervical Spine Wo Contrast Result Date: 08/26/2023 CLINICAL DATA:  Multiple falls yesterday. Increased confusion today. EXAM: CT CERVICAL SPINE WITHOUT CONTRAST TECHNIQUE: Multidetector CT imaging of the cervical spine was performed without intravenous contrast. Multiplanar CT image reconstructions were also generated. RADIATION DOSE REDUCTION: This exam was performed according to the departmental dose-optimization program which includes automated exposure control, adjustment of the mA and/or kV according to patient size and/or use of iterative reconstruction technique. COMPARISON:  None Available. FINDINGS: Alignment: 3 mm grade 1 anterolisthesis is present at C4-5. No other significant listhesis is present. Mild straightening of the normal cervical lordosis is present. Skull base and vertebrae: The craniocervical  junction is within normal limits. Mild pannus formation is noted about the dens. Vertebral body heights are maintained. No acute healing fracture is present. Soft tissues and spinal canal: No prevertebral fluid or swelling. No visible canal hematoma. Disc levels: Asymmetric right-sided facet and uncovertebral spurring lead to moderate to severe right foraminal stenosis at C2-3 C3-4 and C4-5. No more broad-based disc osteophyte complex and bilateral foraminal stenosis is present at C5-6 and C6-7. Upper chest: The lung apices are clear. The thoracic inlet is within normal limits. IMPRESSION: 1. No acute or healing fracture. 2. 3 mm grade 1 anterolisthesis at C4-5. 3. Asymmetric right-sided facet and uncovertebral spurring lead to moderate to severe right foraminal stenosis at C2-3 C3-4 and C4-5. 4. More broad-based disc osteophyte complex and bilateral foraminal stenosis at C5-6 and C6-7. Electronically Signed   By: Marin Roberts M.D.   On: 08/26/2023 20:17   CT Head Wo Contrast Result Date: 08/26/2023 CLINICAL DATA:  Multiple falls yesterday.  Increased confusion. EXAM: CT HEAD WITHOUT CONTRAST TECHNIQUE: Contiguous axial images were obtained from the base of the skull through the vertex without intravenous contrast. RADIATION DOSE REDUCTION: This exam was performed according to the departmental dose-optimization program which includes automated exposure control, adjustment of the mA and/or kV according to patient size and/or use of iterative reconstruction technique. COMPARISON:  MRI of the orbits  without and with contrast 03/02/23 FINDINGS: Brain: Mild atrophy and white matter changes are present bilaterally. No acute infarct, hemorrhage, or mass lesion is present. Deep brain nuclei are within normal limits. The ventricles are of normal size. No significant extraaxial fluid collection is present. The brainstem and cerebellum are within normal limits. Midline structures are within normal limits. Vascular:  No hyperdense vessel or unexpected calcification. Skull: Normal. Negative for fracture or focal lesion. Sinuses/Orbits: The paranasal sinuses and mastoid air cells are clear. Bilateral lens replacements are noted. Globes and orbits are otherwise unremarkable. IMPRESSION: 1. No acute intracranial abnormality or significant interval change. 2. Mild atrophy and white matter disease likely reflects the sequela of chronic microvascular ischemia. Electronically Signed   By: Marin Roberts M.D.   On: 08/26/2023 20:09      Scheduled Meds:  atorvastatin  20 mg Oral QHS   buPROPion  150 mg Oral Q breakfast   enoxaparin (LOVENOX) injection  40 mg Subcutaneous Daily   gabapentin  300 mg Oral TID AC & HS   sodium chloride flush  3 mL Intravenous Q12H   Continuous Infusions:   LOS: 0 days   Time spent: 45 minutes   Noralee Stain, DO [facility] 08/27/2023, 12:44 PM   Available via Epic secure chat 7am-7pm After these hours, please refer to coverage provider listed on [email or web address]

## 2023-08-28 DIAGNOSIS — E871 Hypo-osmolality and hyponatremia: Secondary | ICD-10-CM | POA: Diagnosis not present

## 2023-08-28 LAB — BASIC METABOLIC PANEL
Anion gap: 12 (ref 5–15)
BUN: 15 mg/dL (ref 8–23)
CO2: 18 mmol/L — ABNORMAL LOW (ref 22–32)
Calcium: 10.1 mg/dL (ref 8.9–10.3)
Chloride: 100 mmol/L (ref 98–111)
Creatinine, Ser: 0.61 mg/dL (ref 0.44–1.00)
GFR, Estimated: >60 mL/min (ref >60)
Glucose, Bld: 113 mg/dL — ABNORMAL HIGH (ref 70–99)
Potassium: 4.6 mmol/L (ref 3.5–5.1)
Sodium: 130 mmol/L — ABNORMAL LOW (ref 135–145)

## 2023-08-28 LAB — OSMOLALITY: Osmolality: 278 mosm/kg (ref 275–295)

## 2023-08-28 LAB — MAGNESIUM: Magnesium: 2.2 mg/dL (ref 1.7–2.4)

## 2023-08-28 LAB — URIC ACID: Uric Acid, Serum: 3.4 mg/dL (ref 2.5–7.1)

## 2023-08-28 NOTE — Evaluation (Signed)
 Occupational Therapy Evaluation Patient Details Name: Haley Fuller MRN: [id number] DOB: [birth year] Today's Date: 08/28/2023   History of Present Illness   Pt is 83 yo presenting 2/21 to [facility] ED due to falls, pleuritic pain on the R and confusion. Found R rib fracture. PMH: depression, anxiety, asthma, trigeminal neuralgia and hyperlipidemia.     Clinical Impressions Pt reports ind at baseline with ADLs and functional mobility, lives alone but has PRN assist from family (daughter and son). Pt currently needing up to min A for ADLs, CGA for transfers with RW. Pt with decr attention and memory, needs cues to remain on task and to redirect to answer questions. Discussed memory strategies with pt including using calendar/phone reminders for meds, other important things, pt verbalizes understanding and reports her daughter sometimes helps her with this. Pt presenting with impairments listed below, will follow acutely. Recommend HHOT at d/c.      If plan is discharge home, recommend the following:   A little help with walking and/or transfers;Assistance with cooking/housework;A little help with bathing/dressing/bathroom;Assist for transportation;Direct supervision/assist for financial management;Direct supervision/assist for medications management;Supervision due to cognitive status     Functional Status Assessment   Patient has had a recent decline in their functional status and demonstrates the ability to make significant improvements in function in a reasonable and predictable amount of time.     Equipment Recommendations   Tub/shower seat     Recommendations for Other Services   PT consult     Precautions/Restrictions   Precautions Precautions: Fall Restrictions Weight Bearing Restrictions Per Provider Order: No     Mobility Bed Mobility               General bed mobility comments: OOB in chair upon arrival and departure    Transfers Overall transfer level:  Needs assistance Equipment used: Rolling walker (2 wheels) Transfers: Sit to/from Stand Sit to Stand: Contact guard assist                  Balance Overall balance assessment: Mild deficits observed, not formally tested, Needs assistance                                         ADL either performed or assessed with clinical judgement   ADL Overall ADL's : Needs assistance/impaired Eating/Feeding: Set up;Sitting   Grooming: Set up;Oral care;Wash/dry hands;Standing   Upper Body Bathing: Minimal assistance;Sitting   Lower Body Bathing: Minimal assistance;Sitting/lateral leans   Upper Body Dressing : Contact guard assist;Sitting   Lower Body Dressing: Minimal assistance;Sitting/lateral leans   Toilet Transfer: Contact guard assist;Ambulation;Regular Toilet;Rolling walker (2 wheels)   Toileting- Clothing Manipulation and Hygiene: Contact guard assist       Functional mobility during ADLs: Contact guard assist;Rolling walker (2 wheels)       Vision   Vision Assessment?: No apparent visual deficits     Perception Perception: Not tested       Praxis Praxis: Not tested       Pertinent Vitals/Pain Pain Assessment Pain Assessment: Faces Pain Score: 8  Faces Pain Scale: Hurts whole lot Pain Location: R rib/side Pain Descriptors / Indicators: Discomfort Pain Intervention(s): Limited activity within patient's tolerance, Monitored during session, Repositioned     Extremity/Trunk Assessment Upper Extremity Assessment Upper Extremity Assessment: Generalized weakness   Lower Extremity Assessment Lower Extremity Assessment: Defer to PT evaluation   Cervical /  Trunk Assessment Cervical / Trunk Assessment: Kyphotic   Communication Communication Communication: No apparent difficulties   Cognition Arousal: Alert Behavior During Therapy: WFL for tasks assessed/performed Cognition: Cognition impaired       Memory impairment (select all  impairments): Working [employer] fast streamer, Programmer, systems, Conservation officer, historic buildings Attention impairment (select first level of impairment): Sustained attention, Alternating attention   OT - Cognition Comments: pt with 2 errors on SBT, needs incr time to count backwards 20-1 and incr time to state months of year backwards but is able to do so. Pt with notable decr attention, needs mod cues to remain focused on task at hand or answering question asked. Pt also reports no falls at home, however per chart review pt with prior falls                 Following commands: Intact       Cueing  General Comments   Cueing Techniques: Verbal cues      Exercises     Shoulder Instructions      Home Living Family/patient expects to be discharged to:: Private residence Living Arrangements: Alone Available Help at Discharge: Family;Available PRN/intermittently Type of Home: House Home Access: Stairs to enter;Ramped entrance Entrance Stairs-Number of Steps: 5-6   Home Layout: Multi-level;Able to live on main level with bedroom/bathroom Alternate Level Stairs-Number of Steps: 12-13 Alternate Level Stairs-Rails: Right Bathroom Shower/Tub: Chief Strategy Officer: Standard Bathroom Accessibility: Yes   Home Equipment: Cane - single point;Grab bars - toilet;Rollator (4 wheels)   Additional Comments: children ordered a 3in1? 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, unsure of level of assist they can provide      Prior Functioning/Environment Prior Level of Function : Independent/Modified Independent;Driving             Mobility Comments: SPC outside the home; no AD inside ADLs Comments: ind with ADLs, manages own meds, drives    OT Problem List: Decreased range of motion;Decreased strength;Decreased activity tolerance;Impaired balance (sitting and/or  standing);Decreased cognition;Decreased coordination;Decreased safety awareness   OT Treatment/Interventions: Self-care/ADL training;Therapeutic exercise;DME and/or AE instruction;Energy conservation;Therapeutic activities;Balance training;Patient/family education      OT Goals(Current goals can be found in the care plan section)   Acute Rehab OT Goals Patient Stated Goal: none stated OT Goal Formulation: With patient Time For Goal Achievement: 09/11/23 Potential to Achieve Goals: Good   OT Frequency:  Min 1X/week    Co-evaluation              AM-PAC OT "6 Clicks" Daily Activity     Outcome Measure Help from another person eating meals?: A Little Help from another person taking care of personal grooming?: A Little Help from another person toileting, which includes using toliet, bedpan, or urinal?: A Little Help from another person bathing (including washing, rinsing, drying)?: A Little Help from another person to put on and taking off regular upper body clothing?: A Little Help from another person to put on and taking off regular lower body clothing?: A Little 6 Click Score: 18   End of Session Equipment Utilized During Treatment: Rolling walker (2 wheels) Nurse Communication: Mobility status  Activity Tolerance: Patient tolerated treatment well Patient left: in chair;with call bell/phone within reach;with chair alarm set  OT Visit Diagnosis: Unsteadiness on feet (R26.81);Other abnormalities of gait and mobility (R26.89);Muscle weakness (generalized) (M62.81);History of falling (Z91.81);Other symptoms and  signs involving cognitive function                Time: 1191-4782 OT Time Calculation (min): 44 min Charges:  OT General Charges $OT Visit: 1 Visit OT Evaluation $OT Eval Moderate Complexity: 1 Mod OT Treatments $Self Care/Home Management : 23-37 mins  Carver Fila, OTD, OTR/L SecureChat Preferred Acute Rehab [phone]   Dalphine Handing 08/28/2023, 3:57  PM

## 2023-08-28 NOTE — Plan of Care (Signed)
  Problem: Education: Goal: Knowledge of General Education information will improve Description: Including pain rating scale, medication(s)/side effects and non-pharmacologic comfort measures Outcome: Progressing   Problem: Clinical Measurements: Goal: Ability to maintain clinical measurements within normal limits will improve Outcome: Progressing Goal: Will remain free from infection Outcome: Progressing   Problem: Activity: Goal: Risk for activity intolerance will decrease Outcome: Progressing   Problem: Safety: Goal: Ability to remain free from injury will improve Outcome: Progressing

## 2023-08-28 NOTE — Progress Notes (Signed)
 PROGRESS NOTE    Haley Fuller  ZOX:[id number] DOB: 1940/08/02 DOA: 08/26/2023 PCP: Cleatis Polka., MD     Brief Narrative:  Haley Fuller is an 83 y.o. female with medical history significant for depression, anxiety, asthma, trigeminal neuralgia, and hyperlipidemia who presents with falls, pleuritic pain on the right, and confusion.    Patient reports feeling generally poor with fatigue and balance difficulty over the past few days.  She reports falling multiple times since the night of 08/25/2023.  She has been experiencing pain in the right chest wall with deep breath or movement.  She denies headache, focal numbness or weakness, nausea, vomiting, diarrhea, or loss of appetite.  She has not been drinking more than usual but she does feel that her chronic bilateral lower extremity swelling has increased recently.   Workup revealed hyponatremia with sodium 120.  CT head was negative.  Also found to have right-sided rib fracture.  New events last 24 hours / Subjective: Patient seen in the emergency department with family at bedside.  Patient reports that she is feeling better, was able to get some rest.  She lives at home alone, is independent of ADLs  Assessment & Plan:   Hypoosmolar hyponatremia, SIADH - Was recently started on Trileptal, also on Effexor, both medications held, on fluid restriction with improving sodium levels, advance activity with PT OT, continue to monitor BMP closely.  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  Outpatient follow-up with PCP and pulmonary if required.    DVT prophylaxis:  enoxaparin (LOVENOX) injection 40 mg Start: 08/27/23 1000  Code Status: Full code  Family Communication:   Daughter - [phone] - updated 08/28/23  Farrel Gobble [phone]  on 08/28/2023 at 9:55 AM and message left   Disposition Plan: TBD Status is: Inpatient Remains inpatient appropriate because: Hyponatremia   Objective: Vitals:   08/27/23 1912 08/28/23 0013 08/28/23 0418 08/28/23 0435  BP: (!) 131/56 (!) 104/55 (!) 133/56   Pulse: 88 72 72   Resp: 16 16 12    Temp: 97.6 F (36.4 C) 97.7 F (36.5 C) 97.6 F (36.4 C)   TempSrc: Oral Oral Oral   SpO2: 94% 95% 97%   Weight:    74.8 kg  Height:        Intake/Output Summary (Last 24 hours) at 08/28/2023 0954 Last data filed at 08/27/2023 2109 Gross per 24 hour  Intake 318 ml  Output 800 ml  Net -482 ml   Filed Weights   08/26/23 1857 08/28/23 0435  Weight: 75.3 kg 74.8 kg    Examination:   Awake Alert, No new F.N deficits, Normal affect Unity.AT,PERRAL Supple Neck, No JVD,   Symmetrical Chest wall movement, Good air movement bilaterally, CTAB RRR,No Gallops, Rubs or new Murmurs,  +ve B.Sounds, Abd Soft, No tenderness,   No Cyanosis, Clubbing or edema   Data Reviewed: I have personally reviewed following labs and imaging studies   Data Review:   Inpatient Medications  Scheduled Meds:  atorvastatin  20 mg Oral QHS   buPROPion  150 mg Oral Q breakfast   enoxaparin (LOVENOX) injection  40 mg Subcutaneous Daily   sodium chloride flush  3 mL Intravenous Q12H   Continuous Infusions: PRN Meds:.acetaminophen, albuterol, fentaNYL (SUBLIMAZE) injection, ondansetron **OR** ondansetron (ZOFRAN) IV, oxyCODONE, senna  DVT Prophylaxis  enoxaparin (LOVENOX) injection 40 mg Start: 08/27/23 1000       Recent Labs  Lab 08/26/23 1915 08/27/23 0527  WBC 5.0 4.0  HGB 14.2 13.0   HCT 39.1 36.6  PLT 238 214  MCV 84.4 86.7  MCH 30.7 30.8  MCHC 36.3* 35.5  RDW 11.9 11.9    Recent Labs  Lab 08/26/23 1915 08/27/23 0110 08/27/23 0338 08/27/23 0857 08/28/23 0600  NA 120* 124* 126* 123* 130*  K 4.3  --  4.3 4.4 4.6  CL 86*  --  93* 92* 100  CO2 22  --  20* 19* 18*  ANIONGAP 12  --  13 12 12   GLUCOSE 106*  --  100* 140* 113*  BUN 12  --  10 9 15   CREATININE 0.56  --  0.50 0.48 0.61  AST 39  --   --   --   --   ALT 30  --   --   --   --   ALKPHOS 106  --   --   --   --   BILITOT 0.6  --   --   --   --   ALBUMIN 4.1  --   --   --   --   TSH  --   --   --  2.642  --   MG  --   --   --   --  2.2  CALCIUM 9.6  --  9.8 9.4 10.1      Recent Labs  Lab 08/26/23 1915 08/27/23 0338 08/27/23 0857 08/28/23 0600  TSH  --   --  2.642  --   MG  --   --   --  2.2  CALCIUM 9.6 9.8 9.4 10.1    --------------------------------------------------------------------------------------------------------------- No results found for: "CHOL", "HDL", "LDLCALC", "LDLDIRECT", "TRIG", "CHOLHDL"  No results found for: "HGBA1C" Recent Labs    08/27/23 0857  TSH 2.642  FREET4 0.55*   No results for input(s): "VITAMINB12", "FOLATE", "FERRITIN", "TIBC", "IRON", "RETICCTPCT" in the last 72 hours. ------------------------------------------------------------------------------------------------------------------ Cardiac Enzymes No results for input(s): "CKMB", "TROPONINI", "MYOGLOBIN" in the last 168 hours.  Invalid input(s): "CK"  Micro Results No results found for this or any previous visit (from the past 240 hours).  Radiology Reports  CT CHEST W CONTRAST Result Date: 08/27/2023 CLINICAL DATA:  Recurrent falls with new diagnosis of inappropriate antidiuretic hormone secretion (SIADH) EXAM: CT CHEST WITH CONTRAST TECHNIQUE: Multidetector CT imaging of the chest was performed during intravenous contrast administration. RADIATION DOSE REDUCTION: This exam was performed  according to the departmental dose-optimization program which includes automated exposure control, adjustment of the mA and/or kV according to patient size and/or use of iterative reconstruction technique. CONTRAST:  75mL OMNIPAQUE IOHEXOL 350 MG/ML SOLN COMPARISON:  Right rib radiographs dated 08/26/2023, CTA chest dated 10/15/2021 FINDINGS: Cardiovascular: Normal heart size. No significant pericardial fluid/thickening. Great vessels are normal in course and caliber. No central pulmonary emboli. Coronary artery calcifications and aortic atherosclerosis. Mediastinum/Nodes: Imaged thyroid gland without nodules meeting criteria for imaging follow-up by size. Normal esophagus. No pathologically enlarged axillary, supraclavicular, mediastinal, or hilar lymph nodes. Lungs/Pleura: The central airways are patent. Similar mild diffuse bronchiectasis, most notable in the lingula. Multifocal  subsegmental mucous plugging. Irregular cystic lesion in the peripheral right upper lobe measures 2.7 x 1.7 cm, unchanged with slightly increased mural thickening/surrounding ground-glass opacities. A few ground-glass/tree-in-bud nodules, for example medial lingula, are new. The remainder of multifocal bilateral scattered ground-glass and tree-in-bud nodules are grossly similar compared to 10/15/2021. Right lower lobe relaxation atelectasis. No pneumothorax. Trace right pleural effusion. Upper abdomen: Normal. Musculoskeletal: No acute or abnormal lytic or blastic osseous lesions. Multilevel degenerative changes of the thoracic spine. IMPRESSION: 1. Irregular cystic lesion in the peripheral right upper lobe measures 2.7 x 1.7 cm, unchanged with slightly increased mural thickening/surrounding ground-glass opacities which may reflect superimposed infection/inflammation. 2. A few new ground-glass/tree-in-bud nodules on a background of bronchiectasis and similar chronic multifocal bilateral scattered ground-glass and tree-in-bud nodules,  favored to reflect a chronic, indolent infectious/inflammatory process, including atypical etiologies such as nontuberculous mycobacterial infection. 3. Trace right pleural effusion. 4. Aortic Atherosclerosis (ICD10-I70.0). Coronary artery calcifications. Assessment for potential risk factor modification, dietary therapy or pharmacologic therapy may be warranted, if clinically indicated. Electronically Signed   By: Agustin Cree M.D.   On: 08/27/2023 12:30   DG Ribs Unilateral W/Chest Right Result Date: 08/26/2023 CLINICAL DATA:  Fall with right rib and left knee pain. EXAM: RIGHT RIBS AND CHEST - 3+ VIEW; LEFT KNEE - COMPLETE 4+ VIEW; DG HIP (WITH OR WITHOUT PELVIS) 2-3V LEFT COMPARISON:  10/15/2021. FINDINGS: Left hip with pelvis: No acute fracture or dislocation is seen. Mild-to-moderate degenerative changes are present in the hips bilaterally. Degenerative changes are present in the lower lumbar spine. Right ribs: There is a minimally displaced fracture of the T8 rib on the right. A irregular parenchymal opacity is noted in the mid right lung, which may correspond to previously described nodules. There is blunting of the right costophrenic angle. No pneumothorax is seen. Heart size and mediastinal contours are within normal limits. There is atherosclerotic calcification of the aorta. Left knee: No acute fracture or dislocation is seen. Mild-to-moderate degenerative changes are noted in the medial and patellofemoral compartments. There is a trace suprapatellar joint effusion. The soft tissues are within normal limits. IMPRESSION: 1. Minimally displaced fracture of the T8 rib on the right. 2. Blunting of the right costophrenic angle, possible atelectasis and/or small pleural effusion. 3. Parenchymal opacity in the mid right lung, possibly corresponding to nodules seen on CT chest from 2023. Consider nonemergent CT chest for follow-up. 4. No acute fracture at the left hip or knee. Electronically Signed   By: Thornell Sartorius M.D.   On: 08/26/2023 20:39   DG Knee Complete 4 Views Left Result Date: 08/26/2023 CLINICAL DATA:  Fall with right rib and left knee pain. EXAM: RIGHT RIBS AND CHEST - 3+ VIEW; LEFT KNEE - COMPLETE 4+ VIEW; DG HIP (WITH OR WITHOUT PELVIS) 2-3V LEFT COMPARISON:  10/15/2021. FINDINGS: Left hip with pelvis: No acute fracture or dislocation is seen. Mild-to-moderate degenerative changes are present in the hips bilaterally. Degenerative changes are present in the lower lumbar spine. Right ribs: There is a minimally displaced fracture of the T8 rib on the right. A irregular parenchymal opacity is noted in the mid right lung, which may correspond to previously described nodules. There is blunting of the right costophrenic angle. No pneumothorax is seen. Heart size and mediastinal contours are within normal limits. There is atherosclerotic calcification of the aorta. Left knee: No acute fracture or dislocation is seen. Mild-to-moderate degenerative changes are noted in the medial and patellofemoral compartments. There is a trace suprapatellar  joint effusion. The soft tissues are within normal limits. IMPRESSION: 1. Minimally displaced fracture of the T8 rib on the right. 2. Blunting of the right costophrenic angle, possible atelectasis and/or small pleural effusion. 3. Parenchymal opacity in the mid right lung, possibly corresponding to nodules seen on CT chest from 2023. Consider nonemergent CT chest for follow-up. 4. No acute fracture at the left hip or knee. Electronically Signed   By: Thornell Sartorius M.D.   On: 08/26/2023 20:39   DG Hip Unilat With Pelvis 2-3 Views Left Result Date: 08/26/2023 CLINICAL DATA:  Fall with right rib and left knee pain. EXAM: RIGHT RIBS AND CHEST - 3+ VIEW; LEFT KNEE - COMPLETE 4+ VIEW; DG HIP (WITH OR WITHOUT PELVIS) 2-3V LEFT COMPARISON:  10/15/2021. FINDINGS: Left hip with pelvis: No acute fracture or dislocation is seen. Mild-to-moderate degenerative changes are present in  the hips bilaterally. Degenerative changes are present in the lower lumbar spine. Right ribs: There is a minimally displaced fracture of the T8 rib on the right. A irregular parenchymal opacity is noted in the mid right lung, which may correspond to previously described nodules. There is blunting of the right costophrenic angle. No pneumothorax is seen. Heart size and mediastinal contours are within normal limits. There is atherosclerotic calcification of the aorta. Left knee: No acute fracture or dislocation is seen. Mild-to-moderate degenerative changes are noted in the medial and patellofemoral compartments. There is a trace suprapatellar joint effusion. The soft tissues are within normal limits. IMPRESSION: 1. Minimally displaced fracture of the T8 rib on the right. 2. Blunting of the right costophrenic angle, possible atelectasis and/or small pleural effusion. 3. Parenchymal opacity in the mid right lung, possibly corresponding to nodules seen on CT chest from 2023. Consider nonemergent CT chest for follow-up. 4. No acute fracture at the left hip or knee. Electronically Signed   By: Thornell Sartorius M.D.   On: 08/26/2023 20:39   CT Cervical Spine Wo Contrast Result Date: 08/26/2023 CLINICAL DATA:  Multiple falls yesterday. Increased confusion today. EXAM: CT CERVICAL SPINE WITHOUT CONTRAST TECHNIQUE: Multidetector CT imaging of the cervical spine was performed without intravenous contrast. Multiplanar CT image reconstructions were also generated. RADIATION DOSE REDUCTION: This exam was performed according to the departmental dose-optimization program which includes automated exposure control, adjustment of the mA and/or kV according to patient size and/or use of iterative reconstruction technique. COMPARISON:  None Available. FINDINGS: Alignment: 3 mm grade 1 anterolisthesis is present at C4-5. No other significant listhesis is present. Mild straightening of the normal cervical lordosis is present. Skull base  and vertebrae: The craniocervical junction is within normal limits. Mild pannus formation is noted about the dens. Vertebral body heights are maintained. No acute healing fracture is present. Soft tissues and spinal canal: No prevertebral fluid or swelling. No visible canal hematoma. Disc levels: Asymmetric right-sided facet and uncovertebral spurring lead to moderate to severe right foraminal stenosis at C2-3 C3-4 and C4-5. No more broad-based disc osteophyte complex and bilateral foraminal stenosis is present at C5-6 and C6-7. Upper chest: The lung apices are clear. The thoracic inlet is within normal limits. IMPRESSION: 1. No acute or healing fracture. 2. 3 mm grade 1 anterolisthesis at C4-5. 3. Asymmetric right-sided facet and uncovertebral spurring lead to moderate to severe right foraminal stenosis at C2-3 C3-4 and C4-5. 4. More broad-based disc osteophyte complex and bilateral foraminal stenosis at C5-6 and C6-7. Electronically Signed   By: Marin Roberts M.D.   On: 08/26/2023 20:17  CT Head Wo Contrast Result Date: 08/26/2023 CLINICAL DATA:  Multiple falls yesterday.  Increased confusion. EXAM: CT HEAD WITHOUT CONTRAST TECHNIQUE: Contiguous axial images were obtained from the base of the skull through the vertex without intravenous contrast. RADIATION DOSE REDUCTION: This exam was performed according to the departmental dose-optimization program which includes automated exposure control, adjustment of the mA and/or kV according to patient size and/or use of iterative reconstruction technique. COMPARISON:  MRI of the orbits without and with contrast 03/02/23 FINDINGS: Brain: Mild atrophy and white matter changes are present bilaterally. No acute infarct, hemorrhage, or mass lesion is present. Deep brain nuclei are within normal limits. The ventricles are of normal size. No significant extraaxial fluid collection is present. The brainstem and cerebellum are within normal limits. Midline structures are  within normal limits. Vascular: No hyperdense vessel or unexpected calcification. Skull: Normal. Negative for fracture or focal lesion. Sinuses/Orbits: The paranasal sinuses and mastoid air cells are clear. Bilateral lens replacements are noted. Globes and orbits are otherwise unremarkable. IMPRESSION: 1. No acute intracranial abnormality or significant interval change. 2. Mild atrophy and white matter disease likely reflects the sequela of chronic microvascular ischemia. Electronically Signed   By: Marin Roberts M.D.   On: 08/26/2023 20:09      Signature  -   Susa Raring M.D on 08/28/2023 at 9:55 AM   -  To page go to [email or web address]

## 2023-08-29 DIAGNOSIS — E871 Hypo-osmolality and hyponatremia: Secondary | ICD-10-CM | POA: Diagnosis not present

## 2023-08-29 LAB — BASIC METABOLIC PANEL
Anion gap: 7 (ref 5–15)
BUN: 16 mg/dL (ref 8–23)
CO2: 25 mmol/L (ref 22–32)
Calcium: 9.9 mg/dL (ref 8.9–10.3)
Chloride: 100 mmol/L (ref 98–111)
Creatinine, Ser: 0.63 mg/dL (ref 0.44–1.00)
GFR, Estimated: >60 mL/min (ref >60)
Glucose, Bld: 120 mg/dL — ABNORMAL HIGH (ref 70–99)
Potassium: 5.2 mmol/L — ABNORMAL HIGH (ref 3.5–5.1)
Sodium: 132 mmol/L — ABNORMAL LOW (ref 135–145)

## 2023-08-29 LAB — CBC WITH DIFFERENTIAL/PLATELET
Abs Immature Granulocytes: 0 10*3/uL (ref 0.00–0.07)
Basophils Absolute: 0 10*3/uL (ref 0.0–0.1)
Basophils Relative: 1 %
Eosinophils Absolute: 0.2 10*3/uL (ref 0.0–0.5)
Eosinophils Relative: 4 %
HCT: 40.6 % (ref 36.0–46.0)
Hemoglobin: 14.3 g/dL (ref 12.0–15.0)
Immature Granulocytes: 0 %
Lymphocytes Relative: 31 %
Lymphs Abs: 1.5 10*3/uL (ref 0.7–4.0)
MCH: 31.1 pg (ref 26.0–34.0)
MCHC: 35.2 g/dL (ref 30.0–36.0)
MCV: 88.3 fL (ref 80.0–100.0)
Monocytes Absolute: 1 10*3/uL (ref 0.1–1.0)
Monocytes Relative: 20 %
Neutro Abs: 2.2 10*3/uL (ref 1.7–7.7)
Neutrophils Relative %: 44 %
Platelets: 222 10*3/uL (ref 150–400)
RBC: 4.6 MIL/uL (ref 3.87–5.11)
RDW: 12.4 % (ref 11.5–15.5)
WBC: 4.9 10*3/uL (ref 4.0–10.5)
nRBC: 0 % (ref 0.0–0.2)

## 2023-08-29 LAB — BRAIN NATRIURETIC PEPTIDE: B Natriuretic Peptide: 12.9 pg/mL (ref 0.0–100.0)

## 2023-08-29 LAB — MAGNESIUM: Magnesium: 2.1 mg/dL (ref 1.7–2.4)

## 2023-08-29 MED ORDER — GABAPENTIN 300 MG PO CAPS: 300.0000 mg | ORAL_CAPSULE | Freq: Every day | ORAL | Status: AC

## 2023-08-29 MED ORDER — SODIUM ZIRCONIUM CYCLOSILICATE 10 G PO PACK
10.0000 g | PACK | Freq: Once | ORAL | Status: AC
  Administered 2023-08-29: 10 g via ORAL
  Filled 2023-08-29: qty 1

## 2023-08-29 NOTE — Plan of Care (Signed)
  Problem: Clinical Measurements: Goal: Ability to maintain clinical measurements within normal limits will improve Outcome: Progressing   Problem: Activity: Goal: Risk for activity intolerance will decrease Outcome: Progressing   Problem: Safety: Goal: Ability to remain free from injury will improve Outcome: Progressing   Problem: Skin Integrity: Goal: Risk for impaired skin integrity will decrease Outcome: Progressing

## 2023-08-29 NOTE — Progress Notes (Addendum)
 Reviewed AVS, patient expressed understanding of medications, MD follow up reviewed.   Removed IV, Site clean, dry and intact.  Patient states all belongings brought to the hospital at time of admission are accounted for and packed to take home.  Pt waiting with Dtr in room for 11am dose of meds before discharging

## 2023-08-29 NOTE — Discharge Summary (Addendum)
 Haley Fuller UJW:[id number] DOB: 1941-05-11 DOA: 08/26/2023  PCP: Cleatis Polka., MD  Admit date: 08/26/2023  Discharge date: 08/29/2023  Admitted From: Home   Disposition:  Home   Recommendations for Outpatient Follow-up:   Follow up with PCP in 1-2 weeks  PCP Please obtain BMP/CBC, 2 view CXR in 1week,  (see Discharge instructions)   PCP Please follow up on the following pending results:    Home Health: PT RN if qulifies   Equipment/Devices: as below  Consultations: None  Discharge Condition: Stable    CODE STATUS: Full    Diet Recommendation: Heart Healthy with total 1.5 L fluid restriction per day   Chief Complaint  Patient presents with   Fall     Brief history of present illness from the day of admission and additional interim summary     83 y.o. female with medical history significant for depression, anxiety, asthma, trigeminal neuralgia, and hyperlipidemia who presents with falls, pleuritic pain on the right, and confusion.    Patient reports feeling generally poor with fatigue and balance difficulty over the past few days.  She reports falling multiple times since the night of 08/25/2023.  She has been experiencing pain in the right chest wall with deep breath or movement.  She denies headache, focal numbness or weakness, nausea, vomiting, diarrhea, or loss of appetite.  She has not been drinking more than usual but she does feel that her chronic bilateral lower extremity swelling has increased recently.   Workup revealed hyponatremia with sodium 120.  CT head was negative.  Also found to have right-sided rib fracture.                                                                 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   Mild acute on chronic hyperkalemia.  Lokelma 2 doses, PCP to monitor, of note her potassium noticeably has been on the high normal side for several months.    Recurrent falls  - Likely in setting of hyponatremia, PT OT, well with get home PT and walker, family on board on taking home.   Right-sided rib fracture  - Supportive care, incentive spirometer, and all for pain control.  Much improved symptomatically.   History of trigeminal neuralgia -Status post gamma knife radiosurgery at [facility], Holding Trileptal due to SIADH, her family reported that Neurontin was  causing excessive drowsiness hence it was stopped however family wants it to be resumed at a lower dose, once a day gabapentin will be resumed, family to monitor symptoms closely.   Depression/anxiety -continue Wellbutrin, Effexor resumed with caution, if hyponatremia gets worse then Effexor must be discontinued.  PCP to monitor.   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  Outpatient follow-up with PCP and pulmonary if required.     Discharge diagnosis     Principal Problem:   Hyponatremia Active Problems:   Right trigeminal neuralgia   Depression   Anxiety   Asthma   Rib fracture    Discharge instructions    Discharge Instructions     Discharge instructions   Complete by: As directed    Follow with Primary MD Cleatis Polka., MD in 7 days.  Take the incentive spirometer with you, at home he use it every hour while awake sitting up in the chair.  Get CBC, CMP, 2 view Chest X ray -  checked next visit with your primary MD, check  BMP again in about 2 weeks time.  Activity: As tolerated with Full fall precautions use walker/cane & assistance as needed  Disposition Home    Diet: Heart Healthy with total 1.5 L fluid restriction per day  Special Instructions: If you have smoked or chewed Tobacco  in the last 2 yrs please stop smoking, stop any regular Alcohol  and or any Recreational drug use.  On your next visit with your primary care physician please Get Medicines reviewed and adjusted.  Please request your Prim.MD to go over all Hospital Tests and Procedure/Radiological results at the follow up, please get all Hospital records sent to your Prim MD by signing hospital release before you go home.  If you experience worsening of your admission symptoms, develop shortness of breath, life threatening emergency, suicidal or homicidal thoughts you must seek medical attention immediately by calling 911 or calling your MD immediately  if symptoms less severe.  You Must read complete instructions/literature along with all the possible adverse reactions/side effects for all the Medicines you take and that have been prescribed to you. Take any new Medicines after you have completely understood and accpet all the possible adverse reactions/side effects.   Do not drive when taking Pain medications.  Do not take more than prescribed Pain, Sleep and Anxiety Medications   Increase activity slowly   Complete by: As directed        Discharge Medications   Allergies as of 08/29/2023   No Known Allergies      Medication List     STOP taking these medications    budesonide-formoterol 160-4.5 MCG/ACT inhaler Commonly known as: SYMBICORT   OXcarbazepine 150 MG tablet Commonly known as: TRILEPTAL       TAKE these medications    acetaminophen 500 MG tablet Commonly known as: TYLENOL Take 1,000 mg by mouth every 6 (six) hours as needed for mild pain (pain score 1-3) or moderate pain (pain score 4-6).   albuterol 108  (90 Base) MCG/ACT inhaler Commonly known as: VENTOLIN HFA Inhale 2 puffs into the lungs every 6 (six) hours as needed for wheezing or shortness of breath.   atorvastatin 20 MG tablet Commonly known as: LIPITOR Take 20 mg by mouth at bedtime.   buPROPion 150 MG 24 hr tablet Commonly known as: WELLBUTRIN XL Take 150 mg by mouth daily after breakfast.   calcium carbonate 500 MG chewable  tablet Commonly known as: TUMS - dosed in mg elemental calcium Chew 2 tablets by mouth daily as needed for indigestion or heartburn.   gabapentin 300 MG capsule Commonly known as: NEURONTIN Take 1 capsule (300 mg total) by mouth daily after supper. What changed: when to take this   losartan 50 MG tablet Commonly known as: COZAAR Take 50 mg by mouth 2 (two) times daily.   venlafaxine XR 75 MG 24 hr capsule Commonly known as: EFFEXOR-XR Take 75 mg by mouth daily with breakfast.   venlafaxine 75 MG tablet Commonly known as: EFFEXOR Take 75 mg by mouth 2 (two) times daily.               Durable Medical Equipment  (From admission, onward)           Start     Ordered   08/29/23 0901  For home use only DME Walker rolling  Once       Comments: 5 wheel  Question Answer Comment  Walker: With 5 Inch Wheels   Patient needs a walker to treat with the following condition Weakness      08/29/23 0900             Follow-up Information     Cleatis Polka., MD. Schedule an appointment as soon as possible for a visit in 1 week(s).   Specialty: Internal Medicine Contact information: [address] [place] [address] [phone]                 Major procedures and Radiology Reports - PLEASE review detailed and final reports thoroughly  -      CT CHEST W CONTRAST Result Date: 08/27/2023 CLINICAL DATA:  Recurrent falls with new diagnosis of inappropriate antidiuretic hormone secretion (SIADH) EXAM: CT CHEST WITH CONTRAST TECHNIQUE: Multidetector CT imaging of the  chest was performed during intravenous contrast administration. RADIATION DOSE REDUCTION: This exam was performed according to the departmental dose-optimization program which includes automated exposure control, adjustment of the mA and/or kV according to patient size and/or use of iterative reconstruction technique. CONTRAST:  75mL OMNIPAQUE IOHEXOL 350 MG/ML SOLN COMPARISON:  Right rib radiographs dated 08/26/2023, CTA chest dated 10/15/2021 FINDINGS: Cardiovascular: Normal heart size. No significant pericardial fluid/thickening. Great vessels are normal in course and caliber. No central pulmonary emboli. Coronary artery calcifications and aortic atherosclerosis. Mediastinum/Nodes: Imaged thyroid gland without nodules meeting criteria for imaging follow-up by size. Normal esophagus. No pathologically enlarged axillary, supraclavicular, mediastinal, or hilar lymph nodes. Lungs/Pleura: The central airways are patent. Similar mild diffuse bronchiectasis, most notable in the lingula. Multifocal subsegmental mucous plugging. Irregular cystic lesion in the peripheral right upper lobe measures 2.7 x 1.7 cm, unchanged with slightly increased mural thickening/surrounding ground-glass opacities. A few ground-glass/tree-in-bud nodules, for example medial lingula, are new. The remainder of multifocal bilateral scattered ground-glass and tree-in-bud nodules are grossly similar compared to 10/15/2021. Right lower lobe relaxation atelectasis. No pneumothorax. Trace right pleural effusion. Upper abdomen: Normal. Musculoskeletal: No acute or abnormal lytic or blastic osseous lesions. Multilevel degenerative changes of the thoracic spine. IMPRESSION: 1. Irregular cystic lesion in the peripheral right upper lobe measures 2.7 x 1.7 cm, unchanged with slightly increased mural thickening/surrounding ground-glass opacities which may reflect superimposed infection/inflammation. 2. A few new ground-glass/tree-in-bud nodules on a  background of bronchiectasis and similar chronic multifocal bilateral scattered ground-glass and tree-in-bud nodules, favored to reflect a chronic, indolent infectious/inflammatory process, including atypical etiologies such as nontuberculous mycobacterial infection. 3. Trace right pleural effusion.  4. Aortic Atherosclerosis (ICD10-I70.0). Coronary artery calcifications. Assessment for potential risk factor modification, dietary therapy or pharmacologic therapy may be warranted, if clinically indicated. Electronically Signed   By: Agustin Cree M.D.   On: 08/27/2023 12:30   DG Ribs Unilateral W/Chest Right Result Date: 08/26/2023 CLINICAL DATA:  Fall with right rib and left knee pain. EXAM: RIGHT RIBS AND CHEST - 3+ VIEW; LEFT KNEE - COMPLETE 4+ VIEW; DG HIP (WITH OR WITHOUT PELVIS) 2-3V LEFT COMPARISON:  10/15/2021. FINDINGS: Left hip with pelvis: No acute fracture or dislocation is seen. Mild-to-moderate degenerative changes are present in the hips bilaterally. Degenerative changes are present in the lower lumbar spine. Right ribs: There is a minimally displaced fracture of the T8 rib on the right. A irregular parenchymal opacity is noted in the mid right lung, which may correspond to previously described nodules. There is blunting of the right costophrenic angle. No pneumothorax is seen. Heart size and mediastinal contours are within normal limits. There is atherosclerotic calcification of the aorta. Left knee: No acute fracture or dislocation is seen. Mild-to-moderate degenerative changes are noted in the medial and patellofemoral compartments. There is a trace suprapatellar joint effusion. The soft tissues are within normal limits. IMPRESSION: 1. Minimally displaced fracture of the T8 rib on the right. 2. Blunting of the right costophrenic angle, possible atelectasis and/or small pleural effusion. 3. Parenchymal opacity in the mid right lung, possibly corresponding to nodules seen on CT chest from 2023. Consider  nonemergent CT chest for follow-up. 4. No acute fracture at the left hip or knee. Electronically Signed   By: Thornell Sartorius M.D.   On: 08/26/2023 20:39   DG Knee Complete 4 Views Left Result Date: 08/26/2023 CLINICAL DATA:  Fall with right rib and left knee pain. EXAM: RIGHT RIBS AND CHEST - 3+ VIEW; LEFT KNEE - COMPLETE 4+ VIEW; DG HIP (WITH OR WITHOUT PELVIS) 2-3V LEFT COMPARISON:  10/15/2021. FINDINGS: Left hip with pelvis: No acute fracture or dislocation is seen. Mild-to-moderate degenerative changes are present in the hips bilaterally. Degenerative changes are present in the lower lumbar spine. Right ribs: There is a minimally displaced fracture of the T8 rib on the right. A irregular parenchymal opacity is noted in the mid right lung, which may correspond to previously described nodules. There is blunting of the right costophrenic angle. No pneumothorax is seen. Heart size and mediastinal contours are within normal limits. There is atherosclerotic calcification of the aorta. Left knee: No acute fracture or dislocation is seen. Mild-to-moderate degenerative changes are noted in the medial and patellofemoral compartments. There is a trace suprapatellar joint effusion. The soft tissues are within normal limits. IMPRESSION: 1. Minimally displaced fracture of the T8 rib on the right. 2. Blunting of the right costophrenic angle, possible atelectasis and/or small pleural effusion. 3. Parenchymal opacity in the mid right lung, possibly corresponding to nodules seen on CT chest from 2023. Consider nonemergent CT chest for follow-up. 4. No acute fracture at the left hip or knee. Electronically Signed   By: Thornell Sartorius M.D.   On: 08/26/2023 20:39   DG Hip Unilat With Pelvis 2-3 Views Left Result Date: 08/26/2023 CLINICAL DATA:  Fall with right rib and left knee pain. EXAM: RIGHT RIBS AND CHEST - 3+ VIEW; LEFT KNEE - COMPLETE 4+ VIEW; DG HIP (WITH OR WITHOUT PELVIS) 2-3V LEFT COMPARISON:  10/15/2021. FINDINGS:  Left hip with pelvis: No acute fracture or dislocation is seen. Mild-to-moderate degenerative changes are present in the hips bilaterally. Degenerative  changes are present in the lower lumbar spine. Right ribs: There is a minimally displaced fracture of the T8 rib on the right. A irregular parenchymal opacity is noted in the mid right lung, which may correspond to previously described nodules. There is blunting of the right costophrenic angle. No pneumothorax is seen. Heart size and mediastinal contours are within normal limits. There is atherosclerotic calcification of the aorta. Left knee: No acute fracture or dislocation is seen. Mild-to-moderate degenerative changes are noted in the medial and patellofemoral compartments. There is a trace suprapatellar joint effusion. The soft tissues are within normal limits. IMPRESSION: 1. Minimally displaced fracture of the T8 rib on the right. 2. Blunting of the right costophrenic angle, possible atelectasis and/or small pleural effusion. 3. Parenchymal opacity in the mid right lung, possibly corresponding to nodules seen on CT chest from 2023. Consider nonemergent CT chest for follow-up. 4. No acute fracture at the left hip or knee. Electronically Signed   By: Thornell Sartorius M.D.   On: 08/26/2023 20:39   CT Cervical Spine Wo Contrast Result Date: 08/26/2023 CLINICAL DATA:  Multiple falls yesterday. Increased confusion today. EXAM: CT CERVICAL SPINE WITHOUT CONTRAST TECHNIQUE: Multidetector CT imaging of the cervical spine was performed without intravenous contrast. Multiplanar CT image reconstructions were also generated. RADIATION DOSE REDUCTION: This exam was performed according to the departmental dose-optimization program which includes automated exposure control, adjustment of the mA and/or kV according to patient size and/or use of iterative reconstruction technique. COMPARISON:  None Available. FINDINGS: Alignment: 3 mm grade 1 anterolisthesis is present at C4-5.  No other significant listhesis is present. Mild straightening of the normal cervical lordosis is present. Skull base and vertebrae: The craniocervical junction is within normal limits. Mild pannus formation is noted about the dens. Vertebral body heights are maintained. No acute healing fracture is present. Soft tissues and spinal canal: No prevertebral fluid or swelling. No visible canal hematoma. Disc levels: Asymmetric right-sided facet and uncovertebral spurring lead to moderate to severe right foraminal stenosis at C2-3 C3-4 and C4-5. No more broad-based disc osteophyte complex and bilateral foraminal stenosis is present at C5-6 and C6-7. Upper chest: The lung apices are clear. The thoracic inlet is within normal limits. IMPRESSION: 1. No acute or healing fracture. 2. 3 mm grade 1 anterolisthesis at C4-5. 3. Asymmetric right-sided facet and uncovertebral spurring lead to moderate to severe right foraminal stenosis at C2-3 C3-4 and C4-5. 4. More broad-based disc osteophyte complex and bilateral foraminal stenosis at C5-6 and C6-7. Electronically Signed   By: Marin Roberts M.D.   On: 08/26/2023 20:17   CT Head Wo Contrast Result Date: 08/26/2023 CLINICAL DATA:  Multiple falls yesterday.  Increased confusion. EXAM: CT HEAD WITHOUT CONTRAST TECHNIQUE: Contiguous axial images were obtained from the base of the skull through the vertex without intravenous contrast. RADIATION DOSE REDUCTION: This exam was performed according to the departmental dose-optimization program which includes automated exposure control, adjustment of the mA and/or kV according to patient size and/or use of iterative reconstruction technique. COMPARISON:  MRI of the orbits without and with contrast 03/02/23 FINDINGS: Brain: Mild atrophy and white matter changes are present bilaterally. No acute infarct, hemorrhage, or mass lesion is present. Deep brain nuclei are within normal limits. The ventricles are of normal size. No significant  extraaxial fluid collection is present. The brainstem and cerebellum are within normal limits. Midline structures are within normal limits. Vascular: No hyperdense vessel or unexpected calcification. Skull: Normal. Negative for fracture or focal lesion.  Sinuses/Orbits: The paranasal sinuses and mastoid air cells are clear. Bilateral lens replacements are noted. Globes and orbits are otherwise unremarkable. IMPRESSION: 1. No acute intracranial abnormality or significant interval change. 2. Mild atrophy and white matter disease likely reflects the sequela of chronic microvascular ischemia. Electronically Signed   By: Marin Roberts M.D.   On: 08/26/2023 20:09   MM 3D SCREENING MAMMOGRAM BILATERAL BREAST Result Date: 08/11/2023 CLINICAL DATA:  Screening. EXAM: DIGITAL SCREENING BILATERAL MAMMOGRAM WITH TOMOSYNTHESIS AND CAD TECHNIQUE: Bilateral screening digital craniocaudal and mediolateral oblique mammograms were obtained. Bilateral screening digital breast tomosynthesis was performed. The images were evaluated with computer-aided detection. COMPARISON:  Previous exam(s). ACR Breast Density Category b: There are scattered areas of fibroglandular density. FINDINGS: There are no findings suspicious for malignancy. IMPRESSION: No mammographic evidence of malignancy. A result letter of this screening mammogram will be mailed directly to the patient. RECOMMENDATION: Screening mammogram in one year. (Code:SM-B-01Y) BI-RADS CATEGORY  1: Negative. Electronically Signed   By: Amie Portland M.D.   On: 08/11/2023 16:14    Micro Results    No results found for this or any previous visit (from the past 240 hours).  Today   Subjective    Haley Fuller today has no headache,no chest abdominal pain,no new weakness tingling or numbness, feels much better wants to go home today.    Objective   Blood pressure 155/70, pulse 76, temperature 97.6 F (36.4 C), temperature source Oral, resp. rate 15, height 5\' 6"   (1.676 m), weight 76 kg, SpO2 97%.   Intake/Output Summary (Last 24 hours) at 08/29/2023 0921 Last data filed at 08/28/2023 2120 Gross per 24 hour  Intake 3 ml  Output --  Net 3 ml    Exam  Awake Alert, No new F.N deficits,    [place].AT,PERRAL Supple Neck,   Symmetrical Chest wall movement, Good air movement bilaterally, CTAB RRR,No Gallops,   +ve B.Sounds, Abd Soft, Non tender,  No Cyanosis, Clubbing or edema    Data Review   Recent Labs  Lab 08/26/23 1915 08/27/23 0527 08/29/23 0545  WBC 5.0 4.0 4.9  HGB 14.2 13.0 14.3  HCT 39.1 36.6 40.6  PLT 238 214 222  MCV 84.4 86.7 88.3  MCH 30.7 30.8 31.1  MCHC 36.3* 35.5 35.2  RDW 11.9 11.9 12.4  LYMPHSABS  --   --  1.5  MONOABS  --   --  1.0  EOSABS  --   --  0.2  BASOSABS  --   --  0.0    Recent Labs  Lab 08/26/23 1915 08/27/23 0110 08/27/23 0338 08/27/23 0857 08/28/23 0600 08/29/23 0545  NA 120* 124* 126* 123* 130* 132*  K 4.3  --  4.3 4.4 4.6 5.2*  CL 86*  --  93* 92* 100 100  CO2 22  --  20* 19* 18* 25  ANIONGAP 12  --  13 12 12 7   GLUCOSE 106*  --  100* 140* 113* 120*  BUN 12  --  10 9 15 16   CREATININE 0.56  --  0.50 0.48 0.61 0.63  AST 39  --   --   --   --   --   ALT 30  --   --   --   --   --   ALKPHOS 106  --   --   --   --   --   BILITOT 0.6  --   --   --   --   --  ALBUMIN 4.1  --   --   --   --   --   TSH  --   --   --  2.642  --   --   BNP  --   --   --   --   --  12.9  MG  --   --   --   --  2.2 2.1  CALCIUM 9.6  --  9.8 9.4 10.1 9.9    Total Time in preparing paper work, data evaluation and todays exam - 35 minutes  Signature  -    Susa Raring M.D on 08/29/2023 at 9:21 AM   -  To page go to [email or web address]

## 2023-08-29 NOTE — Discharge Instructions (Addendum)
 Follow with Primary MD Cleatis Polka., MD in 7 days.  Take the incentive spirometer with you, at home he use it every hour while awake sitting up in the chair.  Get CBC, CMP, 2 view Chest X ray -  checked next visit with your primary MD, check BMP again in about 2 weeks time.  Activity: As tolerated with Full fall precautions use walker/cane & assistance as needed  Disposition Home    Diet: Heart Health with 1.5 L total fluid restriction per day.  Special Instructions: If you have smoked or chewed Tobacco  in the last 2 yrs please stop smoking, stop any regular Alcohol  and or any Recreational drug use.  On your next visit with your primary care physician please Get Medicines reviewed and adjusted.  Please request your Prim.MD to go over all Hospital Tests and Procedure/Radiological results at the follow up, please get all Hospital records sent to your Prim MD by signing hospital release before you go home.  If you experience worsening of your admission symptoms, develop shortness of breath, life threatening emergency, suicidal or homicidal thoughts you must seek medical attention immediately by calling 911 or calling your MD immediately  if symptoms less severe.  You Must read complete instructions/literature along with all the possible adverse reactions/side effects for all the Medicines you take and that have been prescribed to you. Take any new Medicines after you have completely understood and accpet all the possible adverse reactions/side effects.   Do not drive when taking Pain medications.  Do not take more than prescribed Pain, Sleep and Anxiety Medications

## 2023-08-29 NOTE — TOC Transition Note (Signed)
 Transition of Care [facility]) - Discharge Note   Patient Details  Name: Haley Fuller MRN: [id number] Date of Birth: 10-07-1940  Transition of [facility]) CM/SW Contact:  Gordy Clement, RN Phone Number: 08/29/2023, 10:00 AM   Clinical Narrative:    Patient will DC to home today. Home Health has been recommended and will be provided by [facility]. AVS updated   DME to be delivered bedside- Daughter wants to wait for DME vs having sent to home. Daughter will be transporting patient home today.            Patient Goals and CMS Choice            Discharge Placement                       Discharge Plan and Services Additional resources added to the After Visit Summary for                                       Social Drivers of Health (SDOH) Interventions SDOH Screenings   Food Insecurity: No Food Insecurity (08/28/2023)  Housing: Low Risk  (08/28/2023)  Transportation Needs: No Transportation Needs (08/28/2023)  Utilities: Not At Risk (08/28/2023)  Financial Resource Strain: Low Risk  (08/21/2023)   Received from [facility]  Physical Activity: Sufficiently Active (08/21/2023)   Received from [facility]  Social Connections: Moderately Integrated (08/28/2023)  Stress: No Stress Concern Present (08/21/2023)   Received from [facility]  Tobacco Use: Low Risk  (08/27/2023)     Readmission Risk Interventions     No data to display

## 2023-08-30 DIAGNOSIS — S2231XD Fracture of one rib, right side, subsequent encounter for fracture with routine healing: Secondary | ICD-10-CM | POA: Diagnosis not present

## 2023-08-30 DIAGNOSIS — E222 Syndrome of inappropriate secretion of antidiuretic hormone: Secondary | ICD-10-CM | POA: Diagnosis not present

## 2023-08-30 DIAGNOSIS — J45909 Unspecified asthma, uncomplicated: Secondary | ICD-10-CM | POA: Diagnosis not present

## 2023-08-30 DIAGNOSIS — I700 Atherosclerosis of aorta: Secondary | ICD-10-CM | POA: Diagnosis not present

## 2023-08-30 DIAGNOSIS — J90 Pleural effusion, not elsewhere classified: Secondary | ICD-10-CM | POA: Diagnosis not present

## 2023-08-30 DIAGNOSIS — E785 Hyperlipidemia, unspecified: Secondary | ICD-10-CM | POA: Diagnosis not present

## 2023-08-30 DIAGNOSIS — F32A Depression, unspecified: Secondary | ICD-10-CM | POA: Diagnosis not present

## 2023-08-30 DIAGNOSIS — F419 Anxiety disorder, unspecified: Secondary | ICD-10-CM | POA: Diagnosis not present

## 2023-08-30 DIAGNOSIS — E875 Hyperkalemia: Secondary | ICD-10-CM | POA: Diagnosis not present

## 2023-08-31 DIAGNOSIS — F331 Major depressive disorder, recurrent, moderate: Secondary | ICD-10-CM | POA: Diagnosis not present

## 2023-08-31 DIAGNOSIS — E871 Hypo-osmolality and hyponatremia: Secondary | ICD-10-CM | POA: Diagnosis not present

## 2023-08-31 DIAGNOSIS — F5104 Psychophysiologic insomnia: Secondary | ICD-10-CM | POA: Diagnosis not present

## 2023-09-02 DIAGNOSIS — J45909 Unspecified asthma, uncomplicated: Secondary | ICD-10-CM | POA: Diagnosis not present

## 2023-09-02 DIAGNOSIS — E222 Syndrome of inappropriate secretion of antidiuretic hormone: Secondary | ICD-10-CM | POA: Diagnosis not present

## 2023-09-02 DIAGNOSIS — E875 Hyperkalemia: Secondary | ICD-10-CM | POA: Diagnosis not present

## 2023-09-02 DIAGNOSIS — I700 Atherosclerosis of aorta: Secondary | ICD-10-CM | POA: Diagnosis not present

## 2023-09-02 DIAGNOSIS — F32A Depression, unspecified: Secondary | ICD-10-CM | POA: Diagnosis not present

## 2023-09-02 DIAGNOSIS — E785 Hyperlipidemia, unspecified: Secondary | ICD-10-CM | POA: Diagnosis not present

## 2023-09-02 DIAGNOSIS — J90 Pleural effusion, not elsewhere classified: Secondary | ICD-10-CM | POA: Diagnosis not present

## 2023-09-02 DIAGNOSIS — S2231XD Fracture of one rib, right side, subsequent encounter for fracture with routine healing: Secondary | ICD-10-CM | POA: Diagnosis not present

## 2023-09-02 DIAGNOSIS — F419 Anxiety disorder, unspecified: Secondary | ICD-10-CM | POA: Diagnosis not present

## 2023-09-05 DIAGNOSIS — E222 Syndrome of inappropriate secretion of antidiuretic hormone: Secondary | ICD-10-CM | POA: Diagnosis not present

## 2023-09-05 DIAGNOSIS — E871 Hypo-osmolality and hyponatremia: Secondary | ICD-10-CM | POA: Diagnosis not present

## 2023-09-05 DIAGNOSIS — Z7189 Other specified counseling: Secondary | ICD-10-CM | POA: Diagnosis not present

## 2023-09-05 DIAGNOSIS — F3341 Major depressive disorder, recurrent, in partial remission: Secondary | ICD-10-CM | POA: Diagnosis not present

## 2023-09-05 DIAGNOSIS — S2231XA Fracture of one rib, right side, initial encounter for closed fracture: Secondary | ICD-10-CM | POA: Diagnosis not present

## 2023-09-05 DIAGNOSIS — I10 Essential (primary) hypertension: Secondary | ICD-10-CM | POA: Diagnosis not present

## 2023-09-05 DIAGNOSIS — G500 Trigeminal neuralgia: Secondary | ICD-10-CM | POA: Diagnosis not present

## 2023-09-05 DIAGNOSIS — J479 Bronchiectasis, uncomplicated: Secondary | ICD-10-CM | POA: Diagnosis not present

## 2023-09-06 DIAGNOSIS — S2231XD Fracture of one rib, right side, subsequent encounter for fracture with routine healing: Secondary | ICD-10-CM | POA: Diagnosis not present

## 2023-09-06 DIAGNOSIS — E875 Hyperkalemia: Secondary | ICD-10-CM | POA: Diagnosis not present

## 2023-09-06 DIAGNOSIS — E222 Syndrome of inappropriate secretion of antidiuretic hormone: Secondary | ICD-10-CM | POA: Diagnosis not present

## 2023-09-06 DIAGNOSIS — J90 Pleural effusion, not elsewhere classified: Secondary | ICD-10-CM | POA: Diagnosis not present

## 2023-09-06 DIAGNOSIS — F419 Anxiety disorder, unspecified: Secondary | ICD-10-CM | POA: Diagnosis not present

## 2023-09-06 DIAGNOSIS — E785 Hyperlipidemia, unspecified: Secondary | ICD-10-CM | POA: Diagnosis not present

## 2023-09-06 DIAGNOSIS — J45909 Unspecified asthma, uncomplicated: Secondary | ICD-10-CM | POA: Diagnosis not present

## 2023-09-06 DIAGNOSIS — F32A Depression, unspecified: Secondary | ICD-10-CM | POA: Diagnosis not present

## 2023-09-06 DIAGNOSIS — I700 Atherosclerosis of aorta: Secondary | ICD-10-CM | POA: Diagnosis not present

## 2023-09-08 DIAGNOSIS — F32A Depression, unspecified: Secondary | ICD-10-CM | POA: Diagnosis not present

## 2023-09-08 DIAGNOSIS — E785 Hyperlipidemia, unspecified: Secondary | ICD-10-CM | POA: Diagnosis not present

## 2023-09-08 DIAGNOSIS — E875 Hyperkalemia: Secondary | ICD-10-CM | POA: Diagnosis not present

## 2023-09-08 DIAGNOSIS — J45909 Unspecified asthma, uncomplicated: Secondary | ICD-10-CM | POA: Diagnosis not present

## 2023-09-08 DIAGNOSIS — S2231XD Fracture of one rib, right side, subsequent encounter for fracture with routine healing: Secondary | ICD-10-CM | POA: Diagnosis not present

## 2023-09-08 DIAGNOSIS — E222 Syndrome of inappropriate secretion of antidiuretic hormone: Secondary | ICD-10-CM | POA: Diagnosis not present

## 2023-09-08 DIAGNOSIS — I700 Atherosclerosis of aorta: Secondary | ICD-10-CM | POA: Diagnosis not present

## 2023-09-08 DIAGNOSIS — J90 Pleural effusion, not elsewhere classified: Secondary | ICD-10-CM | POA: Diagnosis not present

## 2023-09-08 DIAGNOSIS — F419 Anxiety disorder, unspecified: Secondary | ICD-10-CM | POA: Diagnosis not present

## 2023-09-09 DIAGNOSIS — E875 Hyperkalemia: Secondary | ICD-10-CM | POA: Diagnosis not present

## 2023-09-09 DIAGNOSIS — S2231XD Fracture of one rib, right side, subsequent encounter for fracture with routine healing: Secondary | ICD-10-CM | POA: Diagnosis not present

## 2023-09-09 DIAGNOSIS — J45909 Unspecified asthma, uncomplicated: Secondary | ICD-10-CM | POA: Diagnosis not present

## 2023-09-09 DIAGNOSIS — E222 Syndrome of inappropriate secretion of antidiuretic hormone: Secondary | ICD-10-CM | POA: Diagnosis not present

## 2023-09-09 DIAGNOSIS — I700 Atherosclerosis of aorta: Secondary | ICD-10-CM | POA: Diagnosis not present

## 2023-09-09 DIAGNOSIS — F419 Anxiety disorder, unspecified: Secondary | ICD-10-CM | POA: Diagnosis not present

## 2023-09-09 DIAGNOSIS — E785 Hyperlipidemia, unspecified: Secondary | ICD-10-CM | POA: Diagnosis not present

## 2023-09-09 DIAGNOSIS — F32A Depression, unspecified: Secondary | ICD-10-CM | POA: Diagnosis not present

## 2023-09-09 DIAGNOSIS — J90 Pleural effusion, not elsewhere classified: Secondary | ICD-10-CM | POA: Diagnosis not present

## 2023-09-12 DIAGNOSIS — E222 Syndrome of inappropriate secretion of antidiuretic hormone: Secondary | ICD-10-CM | POA: Diagnosis not present

## 2023-09-12 DIAGNOSIS — J45909 Unspecified asthma, uncomplicated: Secondary | ICD-10-CM | POA: Diagnosis not present

## 2023-09-12 DIAGNOSIS — J90 Pleural effusion, not elsewhere classified: Secondary | ICD-10-CM | POA: Diagnosis not present

## 2023-09-12 DIAGNOSIS — I700 Atherosclerosis of aorta: Secondary | ICD-10-CM | POA: Diagnosis not present

## 2023-09-12 DIAGNOSIS — E785 Hyperlipidemia, unspecified: Secondary | ICD-10-CM | POA: Diagnosis not present

## 2023-09-12 DIAGNOSIS — E875 Hyperkalemia: Secondary | ICD-10-CM | POA: Diagnosis not present

## 2023-09-12 DIAGNOSIS — S2231XD Fracture of one rib, right side, subsequent encounter for fracture with routine healing: Secondary | ICD-10-CM | POA: Diagnosis not present

## 2023-09-12 DIAGNOSIS — F32A Depression, unspecified: Secondary | ICD-10-CM | POA: Diagnosis not present

## 2023-09-12 DIAGNOSIS — F419 Anxiety disorder, unspecified: Secondary | ICD-10-CM | POA: Diagnosis not present

## 2023-09-16 DIAGNOSIS — J45909 Unspecified asthma, uncomplicated: Secondary | ICD-10-CM | POA: Diagnosis not present

## 2023-09-16 DIAGNOSIS — J90 Pleural effusion, not elsewhere classified: Secondary | ICD-10-CM | POA: Diagnosis not present

## 2023-09-16 DIAGNOSIS — E875 Hyperkalemia: Secondary | ICD-10-CM | POA: Diagnosis not present

## 2023-09-16 DIAGNOSIS — S2231XD Fracture of one rib, right side, subsequent encounter for fracture with routine healing: Secondary | ICD-10-CM | POA: Diagnosis not present

## 2023-09-16 DIAGNOSIS — F32A Depression, unspecified: Secondary | ICD-10-CM | POA: Diagnosis not present

## 2023-09-16 DIAGNOSIS — E222 Syndrome of inappropriate secretion of antidiuretic hormone: Secondary | ICD-10-CM | POA: Diagnosis not present

## 2023-09-16 DIAGNOSIS — I700 Atherosclerosis of aorta: Secondary | ICD-10-CM | POA: Diagnosis not present

## 2023-09-16 DIAGNOSIS — E785 Hyperlipidemia, unspecified: Secondary | ICD-10-CM | POA: Diagnosis not present

## 2023-09-16 DIAGNOSIS — F419 Anxiety disorder, unspecified: Secondary | ICD-10-CM | POA: Diagnosis not present

## 2023-09-22 DIAGNOSIS — J90 Pleural effusion, not elsewhere classified: Secondary | ICD-10-CM | POA: Diagnosis not present

## 2023-09-22 DIAGNOSIS — E785 Hyperlipidemia, unspecified: Secondary | ICD-10-CM | POA: Diagnosis not present

## 2023-09-22 DIAGNOSIS — F32A Depression, unspecified: Secondary | ICD-10-CM | POA: Diagnosis not present

## 2023-09-22 DIAGNOSIS — I700 Atherosclerosis of aorta: Secondary | ICD-10-CM | POA: Diagnosis not present

## 2023-09-22 DIAGNOSIS — J45909 Unspecified asthma, uncomplicated: Secondary | ICD-10-CM | POA: Diagnosis not present

## 2023-09-22 DIAGNOSIS — E222 Syndrome of inappropriate secretion of antidiuretic hormone: Secondary | ICD-10-CM | POA: Diagnosis not present

## 2023-09-22 DIAGNOSIS — E875 Hyperkalemia: Secondary | ICD-10-CM | POA: Diagnosis not present

## 2023-09-22 DIAGNOSIS — S2231XD Fracture of one rib, right side, subsequent encounter for fracture with routine healing: Secondary | ICD-10-CM | POA: Diagnosis not present

## 2023-09-22 DIAGNOSIS — F419 Anxiety disorder, unspecified: Secondary | ICD-10-CM | POA: Diagnosis not present

## 2023-09-29 DIAGNOSIS — E785 Hyperlipidemia, unspecified: Secondary | ICD-10-CM | POA: Diagnosis not present

## 2023-09-29 DIAGNOSIS — F32A Depression, unspecified: Secondary | ICD-10-CM | POA: Diagnosis not present

## 2023-09-29 DIAGNOSIS — I700 Atherosclerosis of aorta: Secondary | ICD-10-CM | POA: Diagnosis not present

## 2023-09-29 DIAGNOSIS — J90 Pleural effusion, not elsewhere classified: Secondary | ICD-10-CM | POA: Diagnosis not present

## 2023-09-29 DIAGNOSIS — E222 Syndrome of inappropriate secretion of antidiuretic hormone: Secondary | ICD-10-CM | POA: Diagnosis not present

## 2023-09-29 DIAGNOSIS — E875 Hyperkalemia: Secondary | ICD-10-CM | POA: Diagnosis not present

## 2023-09-29 DIAGNOSIS — J45909 Unspecified asthma, uncomplicated: Secondary | ICD-10-CM | POA: Diagnosis not present

## 2023-09-29 DIAGNOSIS — F419 Anxiety disorder, unspecified: Secondary | ICD-10-CM | POA: Diagnosis not present

## 2023-09-29 DIAGNOSIS — S2231XD Fracture of one rib, right side, subsequent encounter for fracture with routine healing: Secondary | ICD-10-CM | POA: Diagnosis not present

## 2023-10-05 DIAGNOSIS — I700 Atherosclerosis of aorta: Secondary | ICD-10-CM | POA: Diagnosis not present

## 2023-10-05 DIAGNOSIS — F32A Depression, unspecified: Secondary | ICD-10-CM | POA: Diagnosis not present

## 2023-10-05 DIAGNOSIS — E222 Syndrome of inappropriate secretion of antidiuretic hormone: Secondary | ICD-10-CM | POA: Diagnosis not present

## 2023-10-05 DIAGNOSIS — J90 Pleural effusion, not elsewhere classified: Secondary | ICD-10-CM | POA: Diagnosis not present

## 2023-10-05 DIAGNOSIS — J45909 Unspecified asthma, uncomplicated: Secondary | ICD-10-CM | POA: Diagnosis not present

## 2023-10-05 DIAGNOSIS — E785 Hyperlipidemia, unspecified: Secondary | ICD-10-CM | POA: Diagnosis not present

## 2023-10-05 DIAGNOSIS — F419 Anxiety disorder, unspecified: Secondary | ICD-10-CM | POA: Diagnosis not present

## 2023-10-05 DIAGNOSIS — S2231XD Fracture of one rib, right side, subsequent encounter for fracture with routine healing: Secondary | ICD-10-CM | POA: Diagnosis not present

## 2023-10-05 DIAGNOSIS — E875 Hyperkalemia: Secondary | ICD-10-CM | POA: Diagnosis not present

## 2023-10-07 DIAGNOSIS — E222 Syndrome of inappropriate secretion of antidiuretic hormone: Secondary | ICD-10-CM | POA: Diagnosis not present

## 2023-10-07 DIAGNOSIS — F419 Anxiety disorder, unspecified: Secondary | ICD-10-CM | POA: Diagnosis not present

## 2023-10-07 DIAGNOSIS — E785 Hyperlipidemia, unspecified: Secondary | ICD-10-CM | POA: Diagnosis not present

## 2023-10-07 DIAGNOSIS — S2231XD Fracture of one rib, right side, subsequent encounter for fracture with routine healing: Secondary | ICD-10-CM | POA: Diagnosis not present

## 2023-10-07 DIAGNOSIS — J45909 Unspecified asthma, uncomplicated: Secondary | ICD-10-CM | POA: Diagnosis not present

## 2023-10-07 DIAGNOSIS — I700 Atherosclerosis of aorta: Secondary | ICD-10-CM | POA: Diagnosis not present

## 2023-10-07 DIAGNOSIS — F32A Depression, unspecified: Secondary | ICD-10-CM | POA: Diagnosis not present

## 2023-10-07 DIAGNOSIS — J90 Pleural effusion, not elsewhere classified: Secondary | ICD-10-CM | POA: Diagnosis not present

## 2023-10-07 DIAGNOSIS — E875 Hyperkalemia: Secondary | ICD-10-CM | POA: Diagnosis not present

## 2023-10-11 DIAGNOSIS — J90 Pleural effusion, not elsewhere classified: Secondary | ICD-10-CM | POA: Diagnosis not present

## 2023-10-11 DIAGNOSIS — E222 Syndrome of inappropriate secretion of antidiuretic hormone: Secondary | ICD-10-CM | POA: Diagnosis not present

## 2023-10-11 DIAGNOSIS — S2231XD Fracture of one rib, right side, subsequent encounter for fracture with routine healing: Secondary | ICD-10-CM | POA: Diagnosis not present

## 2023-10-11 DIAGNOSIS — J45909 Unspecified asthma, uncomplicated: Secondary | ICD-10-CM | POA: Diagnosis not present

## 2023-10-11 DIAGNOSIS — E785 Hyperlipidemia, unspecified: Secondary | ICD-10-CM | POA: Diagnosis not present

## 2023-10-11 DIAGNOSIS — I700 Atherosclerosis of aorta: Secondary | ICD-10-CM | POA: Diagnosis not present

## 2023-10-11 DIAGNOSIS — F32A Depression, unspecified: Secondary | ICD-10-CM | POA: Diagnosis not present

## 2023-10-11 DIAGNOSIS — E875 Hyperkalemia: Secondary | ICD-10-CM | POA: Diagnosis not present

## 2023-10-11 DIAGNOSIS — F419 Anxiety disorder, unspecified: Secondary | ICD-10-CM | POA: Diagnosis not present

## 2023-10-19 DIAGNOSIS — F32A Depression, unspecified: Secondary | ICD-10-CM | POA: Diagnosis not present

## 2023-10-19 DIAGNOSIS — E785 Hyperlipidemia, unspecified: Secondary | ICD-10-CM | POA: Diagnosis not present

## 2023-10-19 DIAGNOSIS — E875 Hyperkalemia: Secondary | ICD-10-CM | POA: Diagnosis not present

## 2023-10-19 DIAGNOSIS — S2231XD Fracture of one rib, right side, subsequent encounter for fracture with routine healing: Secondary | ICD-10-CM | POA: Diagnosis not present

## 2023-10-19 DIAGNOSIS — I700 Atherosclerosis of aorta: Secondary | ICD-10-CM | POA: Diagnosis not present

## 2023-10-19 DIAGNOSIS — E222 Syndrome of inappropriate secretion of antidiuretic hormone: Secondary | ICD-10-CM | POA: Diagnosis not present

## 2023-10-19 DIAGNOSIS — F419 Anxiety disorder, unspecified: Secondary | ICD-10-CM | POA: Diagnosis not present

## 2023-10-19 DIAGNOSIS — J90 Pleural effusion, not elsewhere classified: Secondary | ICD-10-CM | POA: Diagnosis not present

## 2023-10-19 DIAGNOSIS — J45909 Unspecified asthma, uncomplicated: Secondary | ICD-10-CM | POA: Diagnosis not present

## 2023-10-26 DIAGNOSIS — F32A Depression, unspecified: Secondary | ICD-10-CM | POA: Diagnosis not present

## 2023-10-26 DIAGNOSIS — E222 Syndrome of inappropriate secretion of antidiuretic hormone: Secondary | ICD-10-CM | POA: Diagnosis not present

## 2023-10-26 DIAGNOSIS — J45909 Unspecified asthma, uncomplicated: Secondary | ICD-10-CM | POA: Diagnosis not present

## 2023-10-26 DIAGNOSIS — E785 Hyperlipidemia, unspecified: Secondary | ICD-10-CM | POA: Diagnosis not present

## 2023-10-26 DIAGNOSIS — S2231XD Fracture of one rib, right side, subsequent encounter for fracture with routine healing: Secondary | ICD-10-CM | POA: Diagnosis not present

## 2023-10-26 DIAGNOSIS — J90 Pleural effusion, not elsewhere classified: Secondary | ICD-10-CM | POA: Diagnosis not present

## 2023-10-26 DIAGNOSIS — F419 Anxiety disorder, unspecified: Secondary | ICD-10-CM | POA: Diagnosis not present

## 2023-10-26 DIAGNOSIS — I700 Atherosclerosis of aorta: Secondary | ICD-10-CM | POA: Diagnosis not present

## 2023-10-26 DIAGNOSIS — E875 Hyperkalemia: Secondary | ICD-10-CM | POA: Diagnosis not present

## 2023-11-17 DIAGNOSIS — Z79899 Other long term (current) drug therapy: Secondary | ICD-10-CM | POA: Diagnosis not present

## 2023-11-17 DIAGNOSIS — F5104 Psychophysiologic insomnia: Secondary | ICD-10-CM | POA: Diagnosis not present

## 2023-11-17 DIAGNOSIS — E871 Hypo-osmolality and hyponatremia: Secondary | ICD-10-CM | POA: Diagnosis not present

## 2023-11-17 DIAGNOSIS — F331 Major depressive disorder, recurrent, moderate: Secondary | ICD-10-CM | POA: Diagnosis not present

## 2023-11-24 DIAGNOSIS — G500 Trigeminal neuralgia: Secondary | ICD-10-CM | POA: Diagnosis not present

## 2023-11-24 DIAGNOSIS — Z923 Personal history of irradiation: Secondary | ICD-10-CM | POA: Diagnosis not present

## 2023-12-01 DIAGNOSIS — J479 Bronchiectasis, uncomplicated: Secondary | ICD-10-CM | POA: Diagnosis not present

## 2023-12-01 DIAGNOSIS — M47816 Spondylosis without myelopathy or radiculopathy, lumbar region: Secondary | ICD-10-CM | POA: Diagnosis not present

## 2023-12-01 DIAGNOSIS — M545 Low back pain, unspecified: Secondary | ICD-10-CM | POA: Diagnosis not present

## 2023-12-01 DIAGNOSIS — I10 Essential (primary) hypertension: Secondary | ICD-10-CM | POA: Diagnosis not present

## 2023-12-01 DIAGNOSIS — R635 Abnormal weight gain: Secondary | ICD-10-CM | POA: Diagnosis not present

## 2023-12-01 DIAGNOSIS — R053 Chronic cough: Secondary | ICD-10-CM | POA: Diagnosis not present

## 2023-12-01 DIAGNOSIS — F3341 Major depressive disorder, recurrent, in partial remission: Secondary | ICD-10-CM | POA: Diagnosis not present

## 2023-12-01 DIAGNOSIS — M8580 Other specified disorders of bone density and structure, unspecified site: Secondary | ICD-10-CM | POA: Diagnosis not present

## 2023-12-01 DIAGNOSIS — M419 Scoliosis, unspecified: Secondary | ICD-10-CM | POA: Diagnosis not present

## 2024-01-25 DIAGNOSIS — H59813 Chorioretinal scars after surgery for detachment, bilateral: Secondary | ICD-10-CM | POA: Diagnosis not present

## 2024-01-25 DIAGNOSIS — H02042 Spastic entropion of right lower eyelid: Secondary | ICD-10-CM | POA: Diagnosis not present

## 2024-01-25 DIAGNOSIS — Z961 Presence of intraocular lens: Secondary | ICD-10-CM | POA: Diagnosis not present

## 2024-01-25 DIAGNOSIS — H43813 Vitreous degeneration, bilateral: Secondary | ICD-10-CM | POA: Diagnosis not present

## 2024-01-25 DIAGNOSIS — H04123 Dry eye syndrome of bilateral lacrimal glands: Secondary | ICD-10-CM | POA: Diagnosis not present

## 2024-02-20 DIAGNOSIS — F5104 Psychophysiologic insomnia: Secondary | ICD-10-CM | POA: Diagnosis not present

## 2024-02-20 DIAGNOSIS — F331 Major depressive disorder, recurrent, moderate: Secondary | ICD-10-CM | POA: Diagnosis not present

## 2024-02-20 DIAGNOSIS — F3131 Bipolar disorder, current episode depressed, mild: Secondary | ICD-10-CM | POA: Diagnosis not present

## 2024-03-13 ENCOUNTER — Telehealth: Payer: Medicare PPO | Admitting: Family Medicine

## 2024-04-06 DIAGNOSIS — H16223 Keratoconjunctivitis sicca, not specified as Sjogren's, bilateral: Secondary | ICD-10-CM | POA: Diagnosis not present

## 2024-04-06 DIAGNOSIS — H02042 Spastic entropion of right lower eyelid: Secondary | ICD-10-CM | POA: Diagnosis not present

## 2024-04-06 DIAGNOSIS — H109 Unspecified conjunctivitis: Secondary | ICD-10-CM | POA: Diagnosis not present

## 2024-05-28 DIAGNOSIS — K644 Residual hemorrhoidal skin tags: Secondary | ICD-10-CM | POA: Diagnosis not present

## 2024-05-28 DIAGNOSIS — J029 Acute pharyngitis, unspecified: Secondary | ICD-10-CM | POA: Diagnosis not present

## 2024-05-28 DIAGNOSIS — N390 Urinary tract infection, site not specified: Secondary | ICD-10-CM | POA: Diagnosis not present

## 2024-05-28 DIAGNOSIS — M545 Low back pain, unspecified: Secondary | ICD-10-CM | POA: Diagnosis not present

## 2024-05-28 DIAGNOSIS — R300 Dysuria: Secondary | ICD-10-CM | POA: Diagnosis not present

## 2024-07-26 ENCOUNTER — Encounter: Payer: Self-pay | Admitting: Pulmonary Disease

## 2024-07-26 ENCOUNTER — Ambulatory Visit: Admitting: Pulmonary Disease

## 2024-07-26 VITALS — BP 130/62 | HR 77 | Ht 66.5 in | Wt 177.2 lb

## 2024-07-26 DIAGNOSIS — H04123 Dry eye syndrome of bilateral lacrimal glands: Secondary | ICD-10-CM | POA: Diagnosis not present

## 2024-07-26 DIAGNOSIS — R682 Dry mouth, unspecified: Secondary | ICD-10-CM

## 2024-07-26 DIAGNOSIS — J309 Allergic rhinitis, unspecified: Secondary | ICD-10-CM | POA: Diagnosis not present

## 2024-07-26 DIAGNOSIS — J479 Bronchiectasis, uncomplicated: Secondary | ICD-10-CM | POA: Diagnosis not present

## 2024-07-26 MED ORDER — IPRATROPIUM BROMIDE 0.03 % NA SOLN: 2.0000 | Freq: Two times a day (BID) | NASAL | 12 refills | Status: AC | PRN

## 2024-07-26 NOTE — Progress Notes (Signed)
 "  New Patient Pulmonology Office Visit   Subjective:  Patient ID: Haley Fuller, female    DOB: 12/30/1940  MRN: [id number]  Referred by: Loreli Elsie JONETTA Mickey., MD  CC:  Chief Complaint  Patient presents with   Consult    Pt states - PCP has been helping with pneumonia, feel it was time to see a pulm MD , scar tissue was seen, x 10 years ago was told by MD that her sweepers are not working     Discussed the use of AI scribe software for clinical note transcription with the patient, who gave verbal consent to proceed.  History of Present Illness Haley Fuller is an 84 year old female with recurrent pneumonia who presents for evaluation of persistent lung issues. She was referred by Dr. Loreli for evaluation of recurrent pneumonia and bronchiectasis.  She has had frequent pneumonia and other lung infections since about 2008-2010. CT scans in 2023 and about a year ago showed predominant right upper lobe bronchietatic changes with some left lower and middle lobe involvement, including a cystic area and bronchiectasis with dilated and thickened airways.  She does not have daily sputum production and has rare wheeze or shortness of breath, mainly when walking uphill. She denies joint or muscle pains and is not known to have rheumatoid arthritis, Sjogren's syndrome, or lupus, though she has dry eyes and dry mouth that she manages with topical measures and fluids.  She has a persistent runny nose and always carries a handkerchief. She feels lightheaded when taking deep breaths.        Review of Systems  Constitutional:  Negative for chills, fever, malaise/fatigue and weight loss.  HENT:  Positive for congestion. Negative for sinus pain and sore throat.   Eyes: Negative.   Respiratory:  Positive for cough. Negative for hemoptysis, sputum production, shortness of breath and wheezing.   Cardiovascular:  Negative for chest pain, palpitations, orthopnea, claudication and leg swelling.   Gastrointestinal:  Positive for heartburn. Negative for abdominal pain, nausea and vomiting.  Genitourinary: Negative.   Musculoskeletal:  Negative for joint pain and myalgias.  Skin:  Negative for rash.  Neurological:  Negative for weakness.  Endo/Heme/Allergies: Negative.   Psychiatric/Behavioral:  Positive for depression.     Allergies: Patient has no known allergies. Current Medications[1] Past Medical History:  Diagnosis Date   Asthma    Bilateral ankle fractures 2005   Depression    Hyperlipidemia    Hypertension    IFG (impaired fasting glucose)    Lichen planus    OA (osteoarthritis)    hands   Thrombophlebitis leg superficial    right leg   Trigeminal neuralgia    Varicose veins    Past Surgical History:  Procedure Laterality Date   ABDOMINAL HYSTERECTOMY     partial   COLONOSCOPY     Last in 2014   Family History  Problem Relation Age of Onset   Stroke Mother    Bladder Cancer Father    Breast cancer Sister 81 - 59   Cancer Brother    Cancer Brother    Cancer Brother    Cancer Brother    Social History   Socioeconomic History   Marital status: Widowed    Spouse name: Not on file   Number of children: 3   Years of education: 5   Highest education level: Bachelor's degree (e.g., BA, AB, BS)  Occupational History   Occupation: Retired  Tobacco Use   Smoking  status: Never   Smokeless tobacco: Never  Vaping Use   Vaping status: Never Used  Substance and Sexual Activity   Alcohol  use: No    Alcohol /week: 0.0 standard drinks of alcohol    Drug use: No   Sexual activity: Not on file  Other Topics Concern   Not on file  Social History Narrative   She is a widow, her husband was a optician, dispensing.  She has 2 sons born 70 and 59 and a daughter born in 61.  1 of her sons is a arboriculturist near [place].      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      No alcohol   tobacco or drug use.  She has 4 glasses of tea or cups of tea a day.      Right-handed.   Social Drivers of Health   Tobacco Use: Low Risk (07/26/2024)   Patient History    Smoking Tobacco Use: Never    Smokeless Tobacco Use: Never    Passive Exposure: Not on file  Financial Resource Strain: Low Risk (08/21/2023)   [facility] from [facility]   Overall Financial Resource Strain (CARDIA)    Difficulty of Paying Living Expenses: Not hard at all  Food Insecurity: No Food Insecurity (08/28/2023)   Hunger Vital Sign    Worried About Running Out of Food in the Last Year: Never true    Ran Out of Food in the Last Year: Never true  Transportation Needs: No Transportation Needs (08/28/2023)   PRAPARE - Administrator, Civil Service (Medical): No    Lack of Transportation (Non-Medical): No  Physical Activity: Sufficiently Active (08/21/2023)   Received from [facility]   Exercise Vital Sign    On average, how many days per week do you engage in moderate to strenuous exercise (like a brisk walk)?: 7 days    On average, how many minutes do you engage in exercise at this level?: 40 min  Stress: No Stress Concern Present (08/21/2023)   Received from [facility] Occupational [facility] - Occupational Stress Questionnaire    Feeling of Stress : Not at all  Social Connections: Moderately Integrated (08/28/2023)   Social Connection and Isolation Panel    Frequency of Communication with Friends and Family: More than three times a week    Frequency of Social Gatherings with Friends and Family: Three times a week    Attends Religious Services: More than 4 times per year    Active Member of Clubs or Organizations: Yes    Attends Banker Meetings: More than 4 times per year    Marital Status: Widowed  Intimate Partner Violence: Not At Risk (08/28/2023)   Humiliation, Afraid, Rape, and Kick questionnaire    Fear of Current or Ex-Partner: No    Emotionally  Abused: No    Physically Abused: No    Sexually Abused: No  Depression (PHQ2-9): Not on file  Alcohol  Screen: Not on file  Housing: Low Risk (08/28/2023)   Housing Stability Vital Sign    Unable to Pay for Housing in the Last Year: No    Number of Times Moved in the Last Year: 0    Homeless in the Last Year: No  Utilities: Not At Risk (08/28/2023)   [facility] Utilities    Threatened with loss of utilities: No  Health Literacy: Not on file  Objective:  BP 130/62   Pulse 77   Ht 5' 6.5 (1.689 m) Comment: per pt  Wt 177 lb 3.2 oz (80.4 kg)   SpO2 95%   BMI 28.17 kg/m    Physical Exam Constitutional:      General: She is not in acute distress.    Appearance: Normal appearance.  Eyes:     General: No scleral icterus.    Conjunctiva/sclera: Conjunctivae normal.  Cardiovascular:     Rate and Rhythm: Normal rate and regular rhythm.  Pulmonary:     Breath sounds: No wheezing, rhonchi or rales.  Musculoskeletal:     Right lower leg: No edema.     Left lower leg: No edema.  Skin:    General: Skin is warm and dry.  Neurological:     General: No focal deficit present.     Diagnostic Review:  Last CBC Lab Results  Component Value Date   WBC 4.9 08/29/2023   HGB 14.3 08/29/2023   HCT 40.6 08/29/2023   MCV 88.3 08/29/2023   MCH 31.1 08/29/2023   RDW 12.4 08/29/2023   PLT 222 08/29/2023   Last metabolic panel Lab Results  Component Value Date   GLUCOSE 120 (H) 08/29/2023   NA 132 (L) 08/29/2023   K 5.2 (H) 08/29/2023   CL 100 08/29/2023   CO2 25 08/29/2023   BUN 16 08/29/2023   CREATININE 0.63 08/29/2023   GFRNONAA >60 08/29/2023   CALCIUM  9.9 08/29/2023   PROT 6.7 08/26/2023   ALBUMIN 4.1 08/26/2023   LABGLOB 2.2 06/13/2023   BILITOT 0.6 08/26/2023   ALKPHOS 106 08/26/2023   AST 39 08/26/2023   ALT 30 08/26/2023   ANIONGAP 7 08/29/2023   CT Chest 08/27/23 1. Irregular cystic lesion in the peripheral right upper lobe measures 2.7 x 1.7 cm, unchanged with  slightly increased mural thickening/surrounding ground-glass opacities which may reflect superimposed infection/inflammation. 2. A few new ground-glass/tree-in-bud nodules on a background of bronchiectasis and similar chronic multifocal bilateral scattered ground-glass and tree-in-bud nodules, favored to reflect a chronic, indolent infectious/inflammatory process, including atypical etiologies such as nontuberculous mycobacterial infection. 3. Trace right pleural effusion. 4. Aortic Atherosclerosis (ICD10-I70.0). Coronary artery calcifications. Assessment for potential risk factor modification, dietary therapy or pharmacologic therapy may be warranted, if clinically indicated.       Assessment & Plan:   Assessment & Plan Bronchiectasis without complication (HCC)  Orders:   ANA; Future   ANCA screen with reflex titer; Future   Anti-DNA antibody, double-stranded; Future   Anti-scleroderma antibody; Future   Anti-Smith antibody; Future   CYCLIC CITRUL PEPTIDE ANTIBODY, IGG/IGA; Future   Sjogrens syndrome-A extractable nuclear antibody; Future   Sjogrens syndrome-B extractable nuclear antibody; Future   Rheumatoid factor; Future   IgG, IgA, IgM; Future  Dry eyes     Dry mouth     Allergic rhinitis, unspecified seasonality, unspecified trigger  Orders:   ipratropium (ATROVENT ) 0.03 % nasal spray; Place 2 sprays into both nostrils every 12 (twelve) hours as needed for rhinitis.   Assessment and Plan Assessment & Plan Bronchiectasis Chronic bronchiectasis with recurrent pneumonia. CT shows airway dilation and thickening. Possible past infection-related damage. Differential includes Sjogren's syndrome. - Ordered inflammatory labs for autoimmune conditions. - Consider bronchoscopy if symptoms flare or sputum production occurs. - Instructed to call if symptoms flare for sputum culture and potential treatment. - Scheduled follow-up in four months. - No need for daily  airway clearance regimen at this time, but will consider adding in  the future based on symptom burden  Dry eye syndrome Chronic dry eye syndrome with significant discomfort. - Continue current eye drop regimen and home remedies.  Dry mouth Chronic dry mouth possibly related to Sjogren's syndrome. - Continue increased fluid intake.  Allergic rhinitis Chronic runny nose. - Prescribed nasal spray, two sprays in the morning and evening as needed.  Evaluation for possible Sjogren's syndrome Symptoms suggest possible Sjogren's syndrome affecting lungs and glands. - Ordered labs for Sjogren's syndrome antibodies.      Return in about 4 months (around 11/23/2024) for f/u visit Dr. Kara.   Dorn KATHEE Kara, MD     [1]  Current Outpatient Medications:    acetaminophen  (TYLENOL ) 500 MG tablet, Take 1,000 mg by mouth every 6 (six) hours as needed for mild pain (pain score 1-3) or moderate pain (pain score 4-6)., Disp: , Rfl:    albuterol  (VENTOLIN  HFA) 108 (90 Base) MCG/ACT inhaler, Inhale 2 puffs into the lungs every 6 (six) hours as needed for wheezing or shortness of breath., Disp: , Rfl:    atorvastatin  (LIPITOR) 20 MG tablet, Take 20 mg by mouth at bedtime., Disp: , Rfl:    buPROPion  (WELLBUTRIN  XL) 150 MG 24 hr tablet, Take 150 mg by mouth daily after breakfast., Disp: , Rfl:    calcium  carbonate (TUMS - DOSED IN MG ELEMENTAL CALCIUM ) 500 MG chewable tablet, Chew 2 tablets by mouth daily as needed for indigestion or heartburn., Disp: , Rfl:    gabapentin  (NEURONTIN ) 300 MG capsule, Take 1 capsule (300 mg total) by mouth daily after supper., Disp: , Rfl:    ipratropium (ATROVENT ) 0.03 % nasal spray, Place 2 sprays into both nostrils every 12 (twelve) hours as needed for rhinitis., Disp: 30 mL, Rfl: 12   losartan (COZAAR) 50 MG tablet, Take 50 mg by mouth 2 (two) times daily., Disp: , Rfl:    venlafaxine (EFFEXOR) 75 MG tablet, Take 75 mg by mouth 2 (two) times daily., Disp: , Rfl:     venlafaxine XR (EFFEXOR-XR) 75 MG 24 hr capsule, Take 75 mg by mouth daily with breakfast., Disp: , Rfl:   "

## 2024-07-26 NOTE — Assessment & Plan Note (Addendum)
" °  Orders:   ipratropium (ATROVENT ) 0.03 % nasal spray; Place 2 sprays into both nostrils every 12 (twelve) hours as needed for rhinitis.  "

## 2024-07-26 NOTE — Patient Instructions (Addendum)
 You have scattered areas of bronchiectasis on your CT Chest scans.  We will check inflammatory labs today  If you start to develop a respiratory infection with increased mucous production please let us  know and we will obtain sputum cultures at that time  Follow up in 4 months, call sooner if needed

## 2024-07-29 LAB — CYCLIC CITRUL PEPTIDE ANTIBODY, IGG/IGA: Cyclic Citrullin Peptide Ab: 12 U (ref 0–19)

## 2024-08-03 LAB — IGG, IGA, IGM
IgG (Immunoglobin G), Serum: 735 mg/dL (ref 600–1540)
IgM, Serum: 54 mg/dL (ref 50–300)
Immunoglobulin A: 209 mg/dL (ref 70–320)

## 2024-08-03 LAB — SJOGRENS SYNDROME-A EXTRACTABLE NUCLEAR ANTIBODY: SSA (Ro) (ENA) Antibody, IgG: <1 AI

## 2024-08-03 LAB — SJOGRENS SYNDROME-B EXTRACTABLE NUCLEAR ANTIBODY: SSB (La) (ENA) Antibody, IgG: <1 AI

## 2024-08-03 LAB — ANTI-SMITH ANTIBODY: ENA SM Ab Ser-aCnc: <1 AI

## 2024-08-03 LAB — RHEUMATOID FACTOR: Rheumatoid fact SerPl-aCnc: <10 [IU]/mL

## 2024-08-03 LAB — ANTI-DNA ANTIBODY, DOUBLE-STRANDED: ds DNA Ab: 1 [IU]/mL

## 2024-08-03 LAB — ANA: Anti Nuclear Antibody (ANA): NEGATIVE

## 2024-08-03 LAB — ANTI-SCLERODERMA ANTIBODY: Scleroderma (Scl-70) (ENA) Antibody, IgG: <1 AI

## 2024-08-03 LAB — ANCA SCREEN W REFLEX TITER: ANCA SCREEN: NEGATIVE

## 2024-08-04 ENCOUNTER — Ambulatory Visit: Payer: Self-pay | Admitting: Pulmonary Disease

## 2024-12-04 ENCOUNTER — Ambulatory Visit: Admitting: Pulmonary Disease
